# Patient Record
Sex: Male | Born: 1955 | Race: White | Hispanic: No | Marital: Married | Smoking: Never smoker
Health system: Southern US, Community
[De-identification: ages and names within clinical notes are randomized; demographics above are authoritative.]

## PROBLEM LIST (undated history)

## (undated) DIAGNOSIS — E785 Hyperlipidemia, unspecified: Secondary | ICD-10-CM

## (undated) DIAGNOSIS — A6002 Herpesviral infection of other male genital organs: Secondary | ICD-10-CM

## (undated) DIAGNOSIS — K449 Diaphragmatic hernia without obstruction or gangrene: Secondary | ICD-10-CM

## (undated) DIAGNOSIS — N529 Male erectile dysfunction, unspecified: Secondary | ICD-10-CM

## (undated) DIAGNOSIS — K6289 Other specified diseases of anus and rectum: Secondary | ICD-10-CM

## (undated) HISTORY — DX: Hyperlipidemia, unspecified: E78.5

## (undated) HISTORY — DX: Diaphragmatic hernia without obstruction or gangrene: K44.9

## (undated) HISTORY — DX: Herpesviral infection of other male genital organs: A60.02

## (undated) HISTORY — DX: Other specified diseases of anus and rectum: K62.89

## (undated) HISTORY — DX: Male erectile dysfunction, unspecified: N52.9

---
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
## 1987-12-26 HISTORY — PX: RETINAL DETACHMENT SURGERY: SHX105

## 1993-12-25 HISTORY — PX: SHOULDER OPEN ROTATOR CUFF REPAIR: SHX2407

## 2002-02-28 ENCOUNTER — Encounter: Payer: Self-pay | Admitting: Family Medicine

## 2002-02-28 ENCOUNTER — Encounter: Admission: RE | Admit: 2002-02-28 | Discharge: 2002-02-28 | Payer: Self-pay | Admitting: Family Medicine

## 2002-07-25 ENCOUNTER — Encounter: Payer: Self-pay | Admitting: Family Medicine

## 2002-07-25 ENCOUNTER — Encounter: Admission: RE | Admit: 2002-07-25 | Discharge: 2002-07-25 | Payer: Self-pay | Admitting: Family Medicine

## 2005-07-18 ENCOUNTER — Ambulatory Visit (HOSPITAL_COMMUNITY): Admission: RE | Admit: 2005-07-18 | Discharge: 2005-07-18 | Payer: Self-pay | Admitting: Gastroenterology

## 2005-07-18 LAB — HM COLONOSCOPY: HM Colonoscopy: NORMAL

## 2006-06-12 ENCOUNTER — Ambulatory Visit: Payer: Self-pay | Admitting: Family Medicine

## 2006-07-17 ENCOUNTER — Ambulatory Visit: Payer: Self-pay | Admitting: Family Medicine

## 2007-03-18 ENCOUNTER — Ambulatory Visit: Payer: Self-pay | Admitting: Family Medicine

## 2007-03-26 ENCOUNTER — Ambulatory Visit: Payer: Self-pay | Admitting: Family Medicine

## 2007-04-04 ENCOUNTER — Ambulatory Visit: Payer: Self-pay | Admitting: Family Medicine

## 2007-12-03 ENCOUNTER — Ambulatory Visit: Payer: Self-pay | Admitting: Family Medicine

## 2008-02-21 ENCOUNTER — Ambulatory Visit: Payer: Self-pay | Admitting: Family Medicine

## 2008-03-31 ENCOUNTER — Ambulatory Visit: Payer: Self-pay | Admitting: Family Medicine

## 2008-12-15 ENCOUNTER — Ambulatory Visit: Payer: Self-pay | Admitting: Family Medicine

## 2009-06-18 ENCOUNTER — Ambulatory Visit: Payer: Self-pay | Admitting: Family Medicine

## 2009-09-20 ENCOUNTER — Ambulatory Visit: Payer: Self-pay | Admitting: Family Medicine

## 2010-07-27 ENCOUNTER — Ambulatory Visit: Payer: Self-pay | Admitting: Family Medicine

## 2010-10-13 ENCOUNTER — Ambulatory Visit: Payer: Self-pay | Admitting: Family Medicine

## 2011-01-06 ENCOUNTER — Ambulatory Visit
Admission: RE | Admit: 2011-01-06 | Discharge: 2011-01-06 | Payer: Self-pay | Source: Home / Self Care | Attending: Family Medicine | Admitting: Family Medicine

## 2011-04-11 ENCOUNTER — Ambulatory Visit (INDEPENDENT_AMBULATORY_CARE_PROVIDER_SITE_OTHER): Payer: BLUE CROSS/BLUE SHIELD | Admitting: Family Medicine

## 2011-04-11 DIAGNOSIS — M79609 Pain in unspecified limb: Secondary | ICD-10-CM

## 2011-06-23 ENCOUNTER — Encounter: Payer: Self-pay | Admitting: Family Medicine

## 2011-08-07 ENCOUNTER — Telehealth: Payer: Self-pay | Admitting: Family Medicine

## 2011-08-07 MED ORDER — VALACYCLOVIR HCL 500 MG PO TABS: 500.0000 mg | ORAL_TABLET | Freq: Two times a day (BID) | ORAL | Status: AC

## 2011-08-07 NOTE — Telephone Encounter (Signed)
Valtrex renewed.  

## 2011-09-08 ENCOUNTER — Encounter: Payer: Self-pay | Admitting: Family Medicine

## 2011-09-08 ENCOUNTER — Ambulatory Visit (INDEPENDENT_AMBULATORY_CARE_PROVIDER_SITE_OTHER): Payer: BLUE CROSS/BLUE SHIELD | Admitting: Family Medicine

## 2011-09-08 VITALS — BP 108/78 | HR 64 | Ht 73.0 in | Wt 230.0 lb

## 2011-09-08 DIAGNOSIS — E785 Hyperlipidemia, unspecified: Secondary | ICD-10-CM

## 2011-09-08 DIAGNOSIS — Z23 Encounter for immunization: Secondary | ICD-10-CM

## 2011-09-08 DIAGNOSIS — N529 Male erectile dysfunction, unspecified: Secondary | ICD-10-CM

## 2011-09-08 DIAGNOSIS — N509 Disorder of male genital organs, unspecified: Secondary | ICD-10-CM

## 2011-09-08 DIAGNOSIS — Z0000 Encounter for general adult medical examination without abnormal findings: Secondary | ICD-10-CM

## 2011-09-08 DIAGNOSIS — R079 Chest pain, unspecified: Secondary | ICD-10-CM

## 2011-09-08 DIAGNOSIS — R252 Cramp and spasm: Secondary | ICD-10-CM

## 2011-09-08 DIAGNOSIS — R102 Pelvic and perineal pain: Secondary | ICD-10-CM

## 2011-09-08 NOTE — Progress Notes (Signed)
Subjective:    Patient ID: Jason Ibarra, male    DOB: 1956/03/27, 55 y.o.   MRN: [id number]  HPI He is here for complete examination. He has had some bilateral leg cramping that has been intermittent in nature. He continues to have right-sided perineal discomfort. He was seen by urology concerning this however I have no record. His had no discharge, decreased stream, hesitancy. He has had difficulty with erectile dysfunction however his had side effects from the medications causing cramping and headaches. He did have one episode of intermittent chest pain that he describes as a stabbing lasting just for a second that occurred last night. No shortness of breath, diaphoresis. He does have a lesion present on the shaft of the penis has been evaluated in the past and he would like it evaluated again. He continues on a multivitamin . His family and social history were reviewed.   Review of Systems Negative except as above    Objective:   Physical Exam BP 108/78  Pulse 64  Ht 6\' 1"  (1.854 m)  Wt 230 lb (104.327 kg)  BMI 30.34 kg/m2  General Appearance:    Alert, cooperative, no distress, appears stated age  Head:    Normocephalic, without obvious abnormality, atraumatic  Eyes:    PERRL, conjunctiva/corneas clear, EOM's intact, fundi    benign  Ears:    Normal TM's and external ear canals  Nose:   Nares normal, mucosa normal, no drainage or sinus   tenderness  Throat:   Lips, mucosa, and tongue normal; teeth and gums normal  Neck:   Supple, no lymphadenopathy;  thyroid:  no   enlargement/tenderness/nodules; no carotid   bruit or JVD  Back:    Spine nontender, no curvature, ROM normal, no CVA     tenderness  Lungs:     Clear to auscultation bilaterally without wheezes, rales or     ronchi; respirations unlabored  Chest Wall:    No tenderness or deformity   Heart:    Regular rate and rhythm, S1 and S2 normal, no murmur, rub   or gallop  Breast Exam:    No chest wall tenderness, masses or  gynecomastia  Abdomen:     Soft, non-tender, nondistended, normoactive bowel sounds,    no masses, no hepatosplenomegaly  Genitalia:    Normal male external genitalia with a round smooth 1.5 cm lesion at the base of the penis on the ventral surface.  Testicles without masses.        Extremities:   No clubbing, cyanosis or edema  Pulses:   2+ and symmetric all extremities  Skin:   Skin color, texture, turgor normal, no rashes or lesions  Lymph nodes:   Cervical, supraclavicular, and axillary nodes normal  Neurologic:   CNII-XII intact, normal strength, sensation and gait; reflexes 2+ and symmetric throughout          Psych:   Normal mood, affect, hygiene and grooming.    EKG shows no acute changes      Assessment & Plan:   1. Hyperlipidemia LDL goal < 100  Lipid panel  2. ED (erectile dysfunction)    3. Perineal pain in male  Ambulatory referral to Urology  4. Leg cramps    5. Chest pain  PR ELECTROCARDIOGRAM, COMPLETE  6. Routine general medical examination at a health care facility  Flu vaccine greater than or equal to 3yo with preservative IM, CBC w/Diff, Comprehensive metabolic panel, Lipid panel   he is not interested  in trying Viagra which is the only ED medication that  he has not tried. Discussed leg cramps with him and did mention the possible use of mustard.

## 2011-09-08 NOTE — Progress Notes (Signed)
Pt scheduled with Dr. Retta Diones [facility] on 11-03-11 at 2:45pm.  Pt is aware of appointment. CM, LPN

## 2011-09-09 LAB — LIPID PANEL
Cholesterol: 195 mg/dL (ref 0–200)
HDL: 34 mg/dL — ABNORMAL LOW (ref >39)
Triglycerides: 187 mg/dL — ABNORMAL HIGH (ref <150)

## 2011-09-09 LAB — CBC WITH DIFFERENTIAL/PLATELET
Basophils Absolute: 0 10*3/uL (ref 0.0–0.1)
Basophils Relative: 0 % (ref 0–1)
Eosinophils Absolute: 0.1 10*3/uL (ref 0.0–0.7)
Eosinophils Relative: 1 % (ref 0–5)
HCT: 46.7 % (ref 39.0–52.0)
Hemoglobin: 15.5 g/dL (ref 13.0–17.0)
Lymphocytes Relative: 24 % (ref 12–46)
Lymphs Abs: 1.5 10*3/uL (ref 0.7–4.0)
MCH: 30.1 pg (ref 26.0–34.0)
MCHC: 33.2 g/dL (ref 30.0–36.0)
MCV: 90.7 fL (ref 78.0–100.0)
Monocytes Absolute: 0.6 10*3/uL (ref 0.1–1.0)
Monocytes Relative: 9 % (ref 3–12)
Neutro Abs: 4.2 10*3/uL (ref 1.7–7.7)
Neutrophils Relative %: 65 % (ref 43–77)
Platelets: 299 10*3/uL (ref 150–400)
RBC: 5.15 MIL/uL (ref 4.22–5.81)
RDW: 13.6 % (ref 11.5–15.5)
WBC: 6.4 10*3/uL (ref 4.0–10.5)

## 2011-09-09 LAB — COMPREHENSIVE METABOLIC PANEL
Albumin: 4 g/dL (ref 3.5–5.2)
Alkaline Phosphatase: 57 U/L (ref 39–117)
BUN: 12 mg/dL (ref 6–23)
CO2: 26 mEq/L (ref 19–32)
Calcium: 8.9 mg/dL (ref 8.4–10.5)
Chloride: 104 mEq/L (ref 96–112)
Glucose, Bld: 97 mg/dL (ref 70–99)
Potassium: 4.4 mEq/L (ref 3.5–5.3)

## 2011-09-11 ENCOUNTER — Telehealth: Payer: Self-pay

## 2011-09-11 NOTE — Telephone Encounter (Signed)
Called to inform pt labs look good

## 2011-09-11 NOTE — Telephone Encounter (Signed)
Called pt to give lipid results

## 2011-12-05 ENCOUNTER — Encounter (INDEPENDENT_AMBULATORY_CARE_PROVIDER_SITE_OTHER): Payer: Self-pay | Admitting: General Surgery

## 2011-12-05 ENCOUNTER — Ambulatory Visit (INDEPENDENT_AMBULATORY_CARE_PROVIDER_SITE_OTHER): Payer: BLUE CROSS/BLUE SHIELD | Admitting: General Surgery

## 2011-12-05 VITALS — BP 128/90 | HR 68 | Temp 97.6°F | Resp 16 | Ht 75.25 in | Wt 232.0 lb

## 2011-12-05 DIAGNOSIS — K4020 Bilateral inguinal hernia, without obstruction or gangrene, not specified as recurrent: Secondary | ICD-10-CM | POA: Insufficient documentation

## 2011-12-05 NOTE — Progress Notes (Signed)
Patient ID: Jason Ibarra, male   DOB: Oct 10, 1956, 55 y.o.   MRN: [id number]  Chief Complaint  Patient presents with  . Other    Eval right inguinal hernia    HPI Jason Ibarra is a 55 y.o. male.   HPI   He is referred by Dr. Retta Diones for further evaluation and treatment of a right inguinal hernia. Dr. Retta Diones saw him because of right-sided perineal and thigh discomfort. He has some pain extending down to his right foot. On examination, a right inguinal hernia was detected. No significant perineal pathology was noted. He was known to have a small cyst on his penis. The patient states he has discomfort in the right groin area as well. He is also noted some intermittent deviation in his penis at times. No difficulty with urination. No constipation. No chronic cough.  Past Medical History  Diagnosis Date  . Herpes genitalis in men   . Hemorrhoids   . Dyslipidemia   . ED (erectile dysfunction)   . Hyperlipidemia     borderline per patient  . Abdominal pain   . Rectal pain   . Hiatal hernia     Past Surgical History  Procedure Date  . Retinal detachment surgery 1989  . Shoulder open rotator cuff repair 1995  . Tonsillectomy 1962    Family History  Problem Relation Age of Onset  . Hypertension Father     low blood pressure  . Heart disease Father 87    Arrhythmia  . Cancer Paternal Aunt   . Emphysema Paternal Grandmother   . Cancer Maternal Grandmother     ovarian    Social History History  Substance Use Topics  . Smoking status: Never Smoker   . Smokeless tobacco: Never Used  . Alcohol Use: 1.2 oz/week    1 Cans of beer, 1 Shots of liquor per week     2 per month    Allergies  Allergen Reactions  . Penicillins Cross Reactors     Current Outpatient Prescriptions  Medication Sig Dispense Refill  . Multiple Vitamins-Minerals (MULTIVITAMIN WITH MINERALS) tablet Take 1 tablet by mouth daily.        . vitamin C (ASCORBIC ACID) 500 MG tablet Take 500 mg by  mouth daily.        . vitamin E 1000 UNIT capsule Take 1,000 Units by mouth daily.          Review of Systems Review of Systems  Constitutional: Negative.   Respiratory: Negative.   Cardiovascular: Negative.   Gastrointestinal: Negative.   Genitourinary: Negative for dysuria and frequency.       Right groin pain.  Perineal pain.  Hematological: Negative.     Blood pressure 128/90, pulse 68, temperature 97.6 F (36.4 C), temperature source Temporal, resp. rate 16, height 6' 3.25" (1.911 m), weight 232 lb (105.235 kg).  Physical Exam Physical Exam  Constitutional: He appears well-developed and well-nourished. No distress.  HENT:  Head: Normocephalic and atraumatic.  Cardiovascular: Normal rate and regular rhythm.   Pulmonary/Chest: Effort normal and breath sounds normal.  Abdominal: Soft. He exhibits no distension and no mass. There is no tenderness.  Genitourinary: Penis normal.       Moderate sized reducible right inguinal bulge. Small reducible left inguinal bulge. No penile curvature.  Musculoskeletal: Normal range of motion. He exhibits no edema.  Psychiatric: He has a normal mood and affect. His behavior is normal.    Data Reviewed Dr. Lenoria Chime note.  Assessment  Bilateral inguinal hernia with right side being the symptomatic side. I do not think the pain running down his leg to his foot is related to this.  He may have some sciatica.  Plan    We discussed unilateral hernia repair or bilateral hernia repair. I recommend a laparoscopic repair of the bilateral hernias with mesh. He is in agreement with this. However, he would like to wait until spring. Thus I will see him back in 4 months or sooner if hernia becomes more symptomatic.  I have explained the procedure, risks, and aftercare of inguinal hernia repair.  Risks include but are not limited to bleeding, infection, wound problems, anesthesia, recurrence, bladder or intestine injury, urinary retention,  testicular dysfunction, chronic pain, mesh problems.  He seems to understand and agrees to proceed.       Nalini Alcaraz J 12/05/2011, 5:22 PM

## 2011-12-05 NOTE — Patient Instructions (Signed)
Call for a sooner appointment if the hernia is bothering you more.

## 2012-08-06 ENCOUNTER — Encounter: Payer: Self-pay | Admitting: Family Medicine

## 2012-08-06 ENCOUNTER — Ambulatory Visit (INDEPENDENT_AMBULATORY_CARE_PROVIDER_SITE_OTHER): Payer: BC Managed Care – PPO | Admitting: Family Medicine

## 2012-08-06 VITALS — BP 110/60 | HR 70 | Temp 98.3°F | Wt 222.0 lb

## 2012-08-06 DIAGNOSIS — J019 Acute sinusitis, unspecified: Secondary | ICD-10-CM

## 2012-08-06 DIAGNOSIS — J309 Allergic rhinitis, unspecified: Secondary | ICD-10-CM

## 2012-08-06 MED ORDER — AZITHROMYCIN 500 MG PO TABS: 500.0000 mg | ORAL_TABLET | Freq: Every day | ORAL | Status: AC

## 2012-08-06 NOTE — Progress Notes (Signed)
  Subjective:    Patient ID: Jason Ibarra, male    DOB: 05/27/1956, 55 y.o.   MRN: [id number]  HPI Approximately 4 days ago after mowing he had the onset of sore throat followed by nasal congestion and rhinorrhea and now cough ,PND and hoarse voice. No fever, chills, earache. He's also been sneezing and having artery eyes. He has tried Claritin. He does not smoke.  Review of Systems     Objective:   Physical Exam alert and in no distress. Tympanic membranes and canals are normal. Nasal mucosa is swollen and slightly red. No tenderness over maxillary or frontal sinuses. Throat is clear. Tonsils are normal. Neck is supple without adenopathy or thyromegaly. Cardiac exam shows a regular sinus rhythm without murmurs or gallops. Lungs are clear to auscultation.        Assessment & Plan:   1. Acute sinusitis  azithromycin (ZITHROMAX) 500 MG tablet  2. Allergic rhinitis, mild     Discussed the possibility of waiting and seeing how he did over the next several days however he would like to be more aggressive. He will call me in 7-10 days if still having some symptoms.

## 2012-08-06 NOTE — Patient Instructions (Signed)
Take the antibiotic for the next 3 days but down the road in about 7-10 days if you're not totally back to normal give me a call

## 2012-08-28 ENCOUNTER — Telehealth: Payer: Self-pay | Admitting: Internal Medicine

## 2012-08-28 MED ORDER — AZITHROMYCIN 500 MG PO TABS: 500.0000 mg | ORAL_TABLET | Freq: Every day | ORAL | Status: AC

## 2012-08-28 NOTE — Telephone Encounter (Signed)
PT INFORMED

## 2012-08-28 NOTE — Telephone Encounter (Signed)
Let him know that I called in more azithromycin. Tell him that I want to treat the cause of the cough rather than just the cough

## 2012-08-28 NOTE — Telephone Encounter (Signed)
He states that he is better but still coughing. I will give him another course of azithromycin

## 2012-10-01 ENCOUNTER — Encounter (INDEPENDENT_AMBULATORY_CARE_PROVIDER_SITE_OTHER): Payer: Self-pay | Admitting: General Surgery

## 2013-03-24 ENCOUNTER — Encounter: Payer: Self-pay | Admitting: Family Medicine

## 2013-03-24 ENCOUNTER — Ambulatory Visit (INDEPENDENT_AMBULATORY_CARE_PROVIDER_SITE_OTHER): Payer: BC Managed Care – PPO | Admitting: Family Medicine

## 2013-03-24 VITALS — BP 116/78 | HR 78 | Ht 72.5 in | Wt 227.0 lb

## 2013-03-24 DIAGNOSIS — E785 Hyperlipidemia, unspecified: Secondary | ICD-10-CM | POA: Insufficient documentation

## 2013-03-24 DIAGNOSIS — N400 Enlarged prostate without lower urinary tract symptoms: Secondary | ICD-10-CM

## 2013-03-24 DIAGNOSIS — R195 Other fecal abnormalities: Secondary | ICD-10-CM

## 2013-03-24 DIAGNOSIS — Z0000 Encounter for general adult medical examination without abnormal findings: Secondary | ICD-10-CM

## 2013-03-24 DIAGNOSIS — N529 Male erectile dysfunction, unspecified: Secondary | ICD-10-CM

## 2013-03-24 DIAGNOSIS — Z8619 Personal history of other infectious and parasitic diseases: Secondary | ICD-10-CM | POA: Insufficient documentation

## 2013-03-24 LAB — COMPREHENSIVE METABOLIC PANEL
ALT: 17 U/L (ref 0–53)
AST: 22 U/L (ref 0–37)
CO2: 27 mEq/L (ref 19–32)
Calcium: 9.4 mg/dL (ref 8.4–10.5)
Chloride: 106 mEq/L (ref 96–112)
Creat: 1.06 mg/dL (ref 0.50–1.35)
Sodium: 142 mEq/L (ref 135–145)
Total Bilirubin: 0.6 mg/dL (ref 0.3–1.2)
Total Protein: 6.8 g/dL (ref 6.0–8.3)

## 2013-03-24 LAB — CBC WITH DIFFERENTIAL/PLATELET
Basophils Absolute: 0 10*3/uL (ref 0.0–0.1)
Lymphocytes Relative: 18 % (ref 12–46)
Lymphs Abs: 1.6 10*3/uL (ref 0.7–4.0)
Neutrophils Relative %: 73 % (ref 43–77)
Platelets: 300 10*3/uL (ref 150–400)
RBC: 5.03 MIL/uL (ref 4.22–5.81)
RDW: 14.2 % (ref 11.5–15.5)
WBC: 8.5 10*3/uL (ref 4.0–10.5)

## 2013-03-24 LAB — HEMOCCULT GUIAC POC 1CARD (OFFICE)

## 2013-03-24 LAB — LIPID PANEL
HDL: 38 mg/dL — ABNORMAL LOW (ref >39)
LDL Cholesterol: 133 mg/dL — ABNORMAL HIGH (ref 0–99)
Total CHOL/HDL Ratio: 5.2 Ratio
VLDL: 26 mg/dL (ref 0–40)

## 2013-03-24 LAB — PSA: PSA: 0.56 ng/mL (ref <=4.00)

## 2013-03-24 MED ORDER — AVANAFIL 100 MG PO TABS: 1.0000 | ORAL_TABLET | ORAL | Status: DC

## 2013-03-24 NOTE — Progress Notes (Signed)
Subjective:    Patient ID: Jason Ibarra, male    DOB: [birth year], 57 y.o.   MRN: [id number]  HPI Complete examination. He is having some difficulty with persistent cough. It started about 8 weeks ago with URI symptoms. He is slowly getting better but does have coughing paroxysms. No fever, chills, sore throat or earache. He is on multivitamins. No history of allergies. He does not smoke. He also is here to discuss erectile dysfunction. He has noted a slow decline in his ability to get and maintain erection. He has tried Cialis and Levitra both of which caused headaches as well as leg cramps. His meds were reviewed. He does not smoke. Also has a history of herpes genitalis but has not had difficulty with that in quite some time. His work and home life are going quite well. Family and social history were reviewed.   Review of Systems  Constitutional: Negative.   HENT: Negative.   Eyes: Negative.   Respiratory: Negative.   Cardiovascular: Negative.   Gastrointestinal: Negative.   Genitourinary: Negative.   Musculoskeletal: Negative.   Allergic/Immunologic: Negative.   Neurological: Negative.   Hematological: Negative.   Psychiatric/Behavioral: Negative.        Objective:   Physical Exam BP 116/78  Pulse 78  Ht 6' 0.5" (1.842 m)  Wt 227 lb (102.967 kg)  BMI 30.35 kg/m2  SpO2 98%  General Appearance:    Alert, cooperative, no distress, appears stated age  Head:    Normocephalic, without obvious abnormality, atraumatic  Eyes:    PERRL, conjunctiva/corneas clear, EOM's intact, fundi    benign  Ears:    Normal TM's and external ear canals  Nose:   Nares normal, mucosa normal, no drainage or sinus   tenderness  Throat:   Lips, mucosa, and tongue normal; teeth and gums normal  Neck:   Supple, no lymphadenopathy;  thyroid:  no   enlargement/tenderness/nodules; no carotid   bruit or JVD  Back:    Spine nontender, no curvature, ROM normal, no CVA     tenderness  Lungs:     Clear to  auscultation bilaterally without wheezes, rales or     ronchi; respirations unlabored  Chest Wall:    No tenderness or deformity   Heart:    Regular rate and rhythm, S1 and S2 normal, no murmur, rub   or gallop  Breast Exam:    No chest wall tenderness, masses or gynecomastia  Abdomen:     Soft, non-tender, nondistended, normoactive bowel sounds,    no masses, no hepatosplenomegaly  Genitalia:    Normal male external genitalia without lesions.  Testicles without masses.  No inguinal hernias.  Rectal:    Normal sphincter tone, no masses or tenderness; guaiac positive stool.  Prostate smooth, no nodules, enlarged.  Extremities:   No clubbing, cyanosis or edema  Pulses:   2+ and symmetric all extremities  Skin:   Skin color, texture, turgor normal, no rashes or lesions  Lymph nodes:   Cervical, supraclavicular, and axillary nodes normal  Neurologic:   CNII-XII intact, normal strength, sensation and gait; reflexes 2+ and symmetric throughout          Psych:   Normal mood, affect, hygiene and grooming.          Assessment & Plan:  Routine general medical examination at a health care facility - Plan: Lipid panel, CBC with Differential, Comprehensive metabolic panel, PSA, Hemoccult - 1 Card (office)  BPH (benign prostatic hyperplasia) - Plan:  PSA  ED (erectile dysfunction) - Plan: Avanafil (STENDRA) 100 MG TABS  History of herpes genitalis  Hyperlipidemia LDL goal < 130 - Plan: Lipid panel  Stool guaiac positive - Plan: CBC with Differential, Ambulatory referral to Gastroenterology I discussed treatment of erectile dysfunction. I will try him on the new ED medication. He continues to have difficulty, referral will be made. Also his stool was guaiac positive and therefore referral will be made for possible colonoscopy. He is not having any urinary symptoms and therefore no therapy needed for the BPH. He has not had any trouble with the herpes and again no medicines will be given.

## 2013-03-25 NOTE — Progress Notes (Signed)
Quick Note:  CALLED PT HOME # LEFT MESSAGE BLOOD WORK IS NORMAL ______

## 2013-03-25 NOTE — Progress Notes (Signed)
Quick Note:  The blood work is normal ______ 

## 2013-04-24 ENCOUNTER — Encounter: Payer: Self-pay | Admitting: Family Medicine

## 2013-04-24 ENCOUNTER — Ambulatory Visit (INDEPENDENT_AMBULATORY_CARE_PROVIDER_SITE_OTHER): Payer: BC Managed Care – PPO | Admitting: Family Medicine

## 2013-04-24 VITALS — BP 110/70 | HR 74 | Temp 99.2°F | Wt 226.0 lb

## 2013-04-24 DIAGNOSIS — J019 Acute sinusitis, unspecified: Secondary | ICD-10-CM

## 2013-04-24 MED ORDER — AZITHROMYCIN 500 MG PO TABS: 500.0000 mg | ORAL_TABLET | Freq: Every day | ORAL | Status: DC

## 2013-04-24 NOTE — Patient Instructions (Signed)
If you're not back to normal in one week, give me a call for refill

## 2013-04-24 NOTE — Progress Notes (Signed)
  Subjective:    Patient ID: Jason Ibarra, male    DOB: 1956/04/10, 57 y.o.   MRN: [id number]  HPI A 4 day history of headache, myalgias, sinus pressure, PND. He now has a one-day history of sore throat and a dry cough as well as continued malaise. He does not smoke. He has no underlying allergies. He also complains of some slight upper tooth discomfort.   Review of Systems     Objective:   Physical Exam alert and in no distress. Tympanic membranes and canals are normal. Throat is clear. Tonsils are normal. Neck is supple without adenopathy or thyromegaly. Cardiac exam shows a regular sinus rhythm without murmurs or gallops. Lungs are clear to auscultation. Nasal mucosa is slightly red however sinuses are nontender       Assessment & Plan:  Acute sinusitis - Plan: azithromycin (ZITHROMAX) 500 MG tablet he is to call after one week if not entirely better.

## 2013-05-01 ENCOUNTER — Encounter: Payer: Self-pay | Admitting: Internal Medicine

## 2013-05-01 LAB — HM COLONOSCOPY

## 2013-05-12 ENCOUNTER — Encounter: Payer: Self-pay | Admitting: Family Medicine

## 2013-06-23 ENCOUNTER — Encounter: Payer: Self-pay | Admitting: Family Medicine

## 2013-06-23 ENCOUNTER — Ambulatory Visit (INDEPENDENT_AMBULATORY_CARE_PROVIDER_SITE_OTHER): Payer: BC Managed Care – PPO | Admitting: Family Medicine

## 2013-06-23 VITALS — BP 110/70 | HR 78 | Temp 98.0°F | Wt 226.0 lb

## 2013-06-23 DIAGNOSIS — R070 Pain in throat: Secondary | ICD-10-CM

## 2013-06-23 NOTE — Progress Notes (Signed)
  Subjective:    Patient ID: Jason Ibarra, male    DOB: 1956/09/03, 57 y.o.   MRN: [id number]  HPI Jason Ibarra after he was last seen and treated for sinus infection he developed right-sided throat pain. He points a very specific areas to where the pain is which goes from the angle of the jaw to near the [place] notch. When he swallows he does note the discomfort does increase. He also notes intermittent bouts of dizziness and and when he turns his head to the right, the discomfort increases. No nasal congestion, rhinorrhea, headache, PND, nausea or vision or hearing changes.     Review of Systems     Objective:   Physical Exam alert and in no distress. Tympanic membranes and canals are normal. Throat is clear. Tonsils are normal. Neck is supple without adenopathy or thyromegaly. Cardiac exam shows a regular sinus rhythm without murmurs or gallops. Lungs are clear to auscultation.        Assessment & Plan:  Throat pain - Plan: Ambulatory referral to ENT

## 2013-08-21 ENCOUNTER — Other Ambulatory Visit: Payer: Self-pay | Admitting: *Deleted

## 2013-08-21 ENCOUNTER — Telehealth: Payer: Self-pay | Admitting: Internal Medicine

## 2013-08-21 MED ORDER — AVANAFIL 100 MG PO TABS: 1.0000 | ORAL_TABLET | ORAL | Status: DC

## 2013-08-21 MED ORDER — VALACYCLOVIR HCL 500 MG PO TABS: 500.0000 mg | ORAL_TABLET | Freq: Two times a day (BID) | ORAL | Status: DC

## 2013-08-21 NOTE — Telephone Encounter (Signed)
Pt states he needs a refill on valtrex 500mg  and you gave him a sample of stendra 100mg  and he wants to get a rx for that. Send to [facility]

## 2013-08-21 NOTE — Telephone Encounter (Signed)
Sent Valtrex 500mg  #20 BID with PRN refills to [facility].

## 2013-08-21 NOTE — Telephone Encounter (Signed)
Find out the dosing regimen and give him PRN refills

## 2013-10-30 ENCOUNTER — Other Ambulatory Visit: Payer: Self-pay

## 2014-06-01 ENCOUNTER — Encounter: Payer: Self-pay | Admitting: Family Medicine

## 2014-06-01 ENCOUNTER — Telehealth: Payer: Self-pay

## 2014-06-01 ENCOUNTER — Ambulatory Visit (INDEPENDENT_AMBULATORY_CARE_PROVIDER_SITE_OTHER): Payer: BC Managed Care – PPO | Admitting: Family Medicine

## 2014-06-01 VITALS — BP 120/90 | HR 60 | Wt 226.0 lb

## 2014-06-01 DIAGNOSIS — K146 Glossodynia: Secondary | ICD-10-CM

## 2014-06-01 NOTE — Telephone Encounter (Signed)
error 

## 2014-06-01 NOTE — Progress Notes (Signed)
   Subjective:    Patient ID: Jason Ibarra, male    DOB: 1956/03/29, 58 y.o.   MRN: [id number]  HPI He has a four-month history of an ulcerated lesion on the right upper hard palate with a burning sensation on the right side of his tongue and jaw sensitivity. He did see his dentist 4 months ago before these symptoms started and x-rays were taken however nothing was mentioned   Review of Systems     Objective:   Physical Exam Alert and in no distress. 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 Exam of the tongue and palpation of the tongue shows no lesions. No tenderness over jaw or maxilla. Neck has no adenopathy.       Assessment & Plan:  Tongue pain  the whitish lesion on the hard palate is questionable however his symptoms of having this for several months is certainly concerning. Apparently his insurance does not have any oral surgeons in this area. He is to check with his insurance carrier to see who we can refer him to.

## 2014-06-01 NOTE — Telephone Encounter (Signed)
Pt notified and he will call his insurance to see who is in network

## 2014-06-01 NOTE — Telephone Encounter (Signed)
I called the oral surgeons in [place] and none of them take [employer]. Pt would have to pay out of pocket for any visits, do you have any other suggestions? Please Advise

## 2014-06-29 ENCOUNTER — Encounter: Payer: Self-pay | Admitting: Family Medicine

## 2014-06-29 ENCOUNTER — Ambulatory Visit (INDEPENDENT_AMBULATORY_CARE_PROVIDER_SITE_OTHER): Payer: BC Managed Care – PPO | Admitting: Family Medicine

## 2014-06-29 VITALS — BP 110/70 | HR 67 | Ht 73.0 in | Wt 230.0 lb

## 2014-06-29 DIAGNOSIS — N400 Enlarged prostate without lower urinary tract symptoms: Secondary | ICD-10-CM

## 2014-06-29 DIAGNOSIS — Z860100 Personal history of colon polyps, unspecified: Secondary | ICD-10-CM | POA: Insufficient documentation

## 2014-06-29 DIAGNOSIS — E785 Hyperlipidemia, unspecified: Secondary | ICD-10-CM

## 2014-06-29 DIAGNOSIS — F528 Other sexual dysfunction not due to a substance or known physiological condition: Secondary | ICD-10-CM

## 2014-06-29 DIAGNOSIS — Z8619 Personal history of other infectious and parasitic diseases: Secondary | ICD-10-CM

## 2014-06-29 DIAGNOSIS — Z0000 Encounter for general adult medical examination without abnormal findings: Secondary | ICD-10-CM

## 2014-06-29 DIAGNOSIS — F5221 Male erectile disorder: Secondary | ICD-10-CM

## 2014-06-29 LAB — POCT URINALYSIS DIPSTICK
Bilirubin, UA: NEGATIVE
GLUCOSE UA: NEGATIVE
Ketones, UA: NEGATIVE
Leukocytes, UA: NEGATIVE
Nitrite, UA: NEGATIVE
Protein, UA: NEGATIVE
RBC UA: NEGATIVE
SPEC GRAV UA: 1.015
UROBILINOGEN UA: NEGATIVE
pH, UA: 5

## 2014-06-29 MED ORDER — SILDENAFIL CITRATE 50 MG PO TABS: 50.0000 mg | ORAL_TABLET | Freq: Every day | ORAL | Status: DC | PRN

## 2014-06-29 NOTE — Patient Instructions (Signed)
Make sure you cut back on fluids later at night. Try the Viagra and if no results, I will refer he to urology.

## 2014-06-29 NOTE — Progress Notes (Signed)
Subjective:     HPI  Patient is a 58 y.o. male presenting for an annual physical exam.   BPH - Patient has had post-micturition dribble almost every time after he urinates, wakes up about twice a night to urinate Feels like he empties bladder, has good stream. Denies incontinence, hesitancy, urgency. Not on any medications currently.  Erectile dysfunction - uses Stendra occasionally but feels that it is not working, also reports that he felt heart racing after taking the medication. Has tried Levitra and Cialis which gave him adverse effects. He generally has not noticed a difference between any of the medications for ED. Would like to try a different medication.  Hyperlipidemia (LDL) - Eating healthier, does yard work once a week for exercise. Not currently on medication.  Stool guaiac positive - Colonoscopy done about a year ago. Patient states that he was put on 3-5 year surveillance due to finding 2 polyps. Trying to eat healthier, increasing fiber, vegetable intake and trying to work on decreasing red meat.  Genital Herpes -  Takes Valtrex for flares but has only had to use it once in the last year.  Right-side inguinal hernia and groin pain - patient reports having had an inguinal hernia for years. He has not had surgical repair and has not been interested in it. He reports that it is tender to touch. Also reports intermittent right-sided pain over his upper inner thigh. Pain is made worse with sitting for prolonged periods. Relieved by avoiding pressure.  Patient is a non-smoker and drinks rarely. He sees ophthalmologist annually, is scheduled for f/u this week. Sees dentist annually, is scheduled for f/u in August. His last TDap was in 2008. No vaccinations due at this time.  Denies any other questions or concerns at this visit.  Review of Systems  General: -fevers, -fatigue, -unexplained weight gain/loss  HENT: -headaches, -changes in vision, -changes in hearing, -throat  pain Pulmonary: -sob, -difficulty breathing, -pleuritic pain Cardiovascular: - heart racing, -palpitations, -chest pain GI: -abdominal pain, -n/v, -diarrhea, -constipation Skin: +lesion on his left arm, -skin infections;  Endocrine: -polyphagia, -polydipsia, -polyuria GU: +right-sided groin pain, +right inguinal hernia, -dysuria, -hematuria   MSK: -joint pain, -back pain, -myalgias     Objective:   Physical Exam  BP 110/70  Pulse 67  Ht 6\' 1"  (1.854 m)  Wt 230 lb (104.327 kg)  BMI 30.35 kg/m2   General appearance: alert, no distress, WD/WN,  Skin: warm, dry and intact; benign well-demarcated 1 cm slightly pigmented lesion is noted on the left forearm just proximal to the elbow HEENT: normocephalic, conjunctiva/corneas normal, sclerae anicteric, PERRLA, EOMi, nares patent, no discharge or erythema, pharynx normal Oral cavity: MMM, tongue normal, teeth normal Neck: supple, no lymphadenopathy, no thyromegaly, no masses, normal ROM Chest: non tender, normal shape and expansion Heart: RRR, normal S1, S2, no murmurs Lungs: CTA bilaterally, no wheezes, rhonchi, or rales Abdomen: +bs, soft, non tender, non distended, no masses, no hepatomegaly, no splenomegaly, no bruits GU: Right-sided inguinal tenderness Extremities: no edema, no cyanosis, no clubbing Pulses: 2+ symmetric, upper and lower extremities, normal cap refill Psychiatric: normal affect, behavior normal, pleasant       Assessment and Plan:     BPH - limit water intake at night. Continue to monitor symptoms.  Erectile dysfunction - stop Stendra, start Viagra samples 50mg -100mg  at a time. If this does not work, I will refer him to urology.  Hyperlipidemia (LDL) - repeat lipid panel today. Consider statin pending labs.  Stool  guaiac positive - s/p colonoscopy, colonic polyps found, repeat colonoscopy in 3-5 years. Continue healthy diet, avoid red meats.  Genital Herpes - stable, continue Valtrex as needed.  Right-side  inguinal hernia and groin pain - likely ostial skeletal in nature since it tends to bother him more when he sits down and when he changes positions, it helps  Continue to monitor.   Discussed healthy lifestyle, diet, exercise, preventative care, vaccinations, and addressed their concerns.    Patient was seen in conjunction with PA student Jaynee Eagles, and I have also evaluated and examined patient, agree with student's notes, student supervised by me.

## 2014-06-30 LAB — LIPID PANEL
CHOL/HDL RATIO: 5.2 ratio
CHOLESTEROL: 206 mg/dL — AB (ref 0–200)
HDL: 40 mg/dL (ref >39)
LDL Cholesterol: 136 mg/dL — ABNORMAL HIGH (ref 0–99)
TRIGLYCERIDES: 148 mg/dL (ref <150)
VLDL: 30 mg/dL (ref 0–40)

## 2014-10-09 ENCOUNTER — Other Ambulatory Visit: Payer: Self-pay

## 2014-11-06 ENCOUNTER — Encounter: Payer: Self-pay | Admitting: Family Medicine

## 2014-11-06 ENCOUNTER — Ambulatory Visit (INDEPENDENT_AMBULATORY_CARE_PROVIDER_SITE_OTHER): Payer: BC Managed Care – PPO | Admitting: Family Medicine

## 2014-11-06 VITALS — BP 120/70 | HR 61 | Temp 98.2°F | Wt 230.0 lb

## 2014-11-06 DIAGNOSIS — J019 Acute sinusitis, unspecified: Secondary | ICD-10-CM

## 2014-11-06 MED ORDER — CLARITHROMYCIN 500 MG PO TABS: 500.0000 mg | ORAL_TABLET | Freq: Two times a day (BID) | ORAL | Status: DC

## 2014-11-06 NOTE — Patient Instructions (Signed)
Take all the antibiotic and if not totally back to normal when you finish, give me a call 

## 2014-11-06 NOTE — Progress Notes (Signed)
Subjective:     Patient ID: Jason Ibarra, male   DOB: [birth year], 58 y.o.   MRN: [id number]  HPI   Jason Ibarra reports having developed a cough, headache, and sinus pressure 12 days ago.  He has developed increasing sinus pain, a productive  Cough,PNDand upper tooth pain and his symptoms are interfering with his sleep.  He denies fever, sore throat, shortness of breath, or chest pain.  He says he has symptoms like this 1-2x per year, and without treatment they do not abate.  He is a never smoker and does not experience seasonal alergies.  Notably, he has a drug allergy to penicillin.  His wife had a reported cold approximately beginning with his current symptoms, but she has recovered without complication.  Review of Systems   Denies dizziness, stiff neck, dyspnea, nausea, vomiting, or diarrhea.     Objective:   Physical Exam  HEENT: Tympanic membranes clear without swelling or drainage.  Frontal sinuses tender to  palpation.  Erythematous, boggy nares noted.  Evidence of postnasal drip.  Minimal lymphadenopathy.  CV: RRR with normal S1/S2.  Pulm: Clear to ascultation bilaterally     Assessment:     Jason Ibarra symptoms are consistent with an acute bacterial sinusitis given an evolving course without resolution now >10 days.  Furthermore, he has symptoms like this annually which respond to antibiotics.  He should receive a 10 day course of antibiotics with instruction to call for more if his symptoms do not resolve entirely.  Amoxicillin-clavanulate is first line for acute sinusitis, but given his penicillin allergy we will initiate clarithromycin.  Plan:   Acute sinusitis with symptoms > 10 days - Plan: clarithromycin (BIAXIN) 500 MG tablet

## 2015-05-26 ENCOUNTER — Ambulatory Visit (INDEPENDENT_AMBULATORY_CARE_PROVIDER_SITE_OTHER): Payer: BLUE CROSS/BLUE SHIELD | Admitting: Family Medicine

## 2015-05-26 ENCOUNTER — Encounter: Payer: Self-pay | Admitting: Family Medicine

## 2015-05-26 VITALS — BP 112/74 | HR 76 | Wt 228.8 lb

## 2015-05-26 DIAGNOSIS — M79604 Pain in right leg: Secondary | ICD-10-CM

## 2015-05-26 DIAGNOSIS — M79601 Pain in right arm: Secondary | ICD-10-CM

## 2015-05-26 NOTE — Patient Instructions (Signed)
Take 4 Advil 3 times a day for the next 10 days and if no improvement, call me

## 2015-05-26 NOTE — Progress Notes (Signed)
   Subjective:    Patient ID: [name], male    DOB: 1956-06-04, 59 y.o.   MRN: [id number]  HPI He states that 2-1/2 months ago he had the onset of a burning sensation in the right antecubital fossa. It lasted 2 days. There was no associated rash, change in color of the skin weakness. Apparently 3 days after that he lifted some heavy objects and experienced pain in the lateral forearm area. It radiated into the forearm and down into the thumb and index finger. Gripping something actually makes the pain worse. He is able to use his computer and type without difficulty. He cannot relate this to any wrist or elbow position. Profen did help.   Review of Systems     Objective:   Physical Exam Full motion of the right elbow with no swelling or palpable tenderness. Slight discomfort to the lateral forearm area. Normal motor, sensory and DTRs. Pulses are normal. Gripping the hand did cause forearm pain. Wrist position had no effect.       Assessment & Plan:  Arm pain, anterior, right I discussed options with him. This sounds more muscular than anything but no definite reason behind it. He will do ibuprofen 800 3 times a day for the next 10 days. If he continues have difficulty, I will refer him for further evaluation including ultrasound. He is comfortable with that approach.

## 2015-07-01 ENCOUNTER — Ambulatory Visit (INDEPENDENT_AMBULATORY_CARE_PROVIDER_SITE_OTHER): Payer: BLUE CROSS/BLUE SHIELD | Admitting: Family Medicine

## 2015-07-01 ENCOUNTER — Encounter: Payer: Self-pay | Admitting: Family Medicine

## 2015-07-01 VITALS — BP 120/70 | HR 68 | Ht 73.0 in | Wt 225.0 lb

## 2015-07-01 DIAGNOSIS — F5221 Male erectile disorder: Secondary | ICD-10-CM

## 2015-07-01 DIAGNOSIS — N400 Enlarged prostate without lower urinary tract symptoms: Secondary | ICD-10-CM | POA: Diagnosis not present

## 2015-07-01 DIAGNOSIS — Z23 Encounter for immunization: Secondary | ICD-10-CM

## 2015-07-01 DIAGNOSIS — Z0000 Encounter for general adult medical examination without abnormal findings: Secondary | ICD-10-CM | POA: Diagnosis not present

## 2015-07-01 DIAGNOSIS — E785 Hyperlipidemia, unspecified: Secondary | ICD-10-CM | POA: Diagnosis not present

## 2015-07-01 DIAGNOSIS — F528 Other sexual dysfunction not due to a substance or known physiological condition: Secondary | ICD-10-CM

## 2015-07-01 DIAGNOSIS — Z8619 Personal history of other infectious and parasitic diseases: Secondary | ICD-10-CM | POA: Diagnosis not present

## 2015-07-01 DIAGNOSIS — K4020 Bilateral inguinal hernia, without obstruction or gangrene, not specified as recurrent: Secondary | ICD-10-CM

## 2015-07-01 LAB — LIPID PANEL
Cholesterol: 200 mg/dL (ref 0–200)
HDL: 41 mg/dL (ref >=40)
LDL Cholesterol: 131 mg/dL — ABNORMAL HIGH (ref 0–99)
TRIGLYCERIDES: 141 mg/dL (ref <150)
Total CHOL/HDL Ratio: 4.9 Ratio
VLDL: 28 mg/dL (ref 0–40)

## 2015-07-01 LAB — CBC WITH DIFFERENTIAL/PLATELET
Basophils Absolute: 0 10*3/uL (ref 0.0–0.1)
Basophils Relative: 0 % (ref 0–1)
EOS ABS: 0.1 10*3/uL (ref 0.0–0.7)
Eosinophils Relative: 1 % (ref 0–5)
HCT: 46.3 % (ref 39.0–52.0)
Hemoglobin: 16 g/dL (ref 13.0–17.0)
Lymphocytes Relative: 25 % (ref 12–46)
Lymphs Abs: 2 10*3/uL (ref 0.7–4.0)
MCH: 30.2 pg (ref 26.0–34.0)
MCHC: 34.6 g/dL (ref 30.0–36.0)
MCV: 87.4 fL (ref 78.0–100.0)
MONOS PCT: 9 % (ref 3–12)
MPV: 10.2 fL (ref 8.6–12.4)
Monocytes Absolute: 0.7 10*3/uL (ref 0.1–1.0)
NEUTROS PCT: 65 % (ref 43–77)
Neutro Abs: 5.3 10*3/uL (ref 1.7–7.7)
Platelets: 325 10*3/uL (ref 150–400)
RBC: 5.3 MIL/uL (ref 4.22–5.81)
RDW: 14 % (ref 11.5–15.5)
WBC: 8.1 10*3/uL (ref 4.0–10.5)

## 2015-07-01 LAB — POCT URINALYSIS DIPSTICK
BILIRUBIN UA: NEGATIVE
Blood, UA: NEGATIVE
Glucose, UA: NEGATIVE
KETONES UA: NEGATIVE
LEUKOCYTES UA: NEGATIVE
Nitrite, UA: NEGATIVE
PH UA: 6
PROTEIN UA: NEGATIVE
Spec Grav, UA: 1.025
Urobilinogen, UA: NEGATIVE

## 2015-07-01 LAB — COMPREHENSIVE METABOLIC PANEL
ALK PHOS: 67 U/L (ref 39–117)
ALT: 19 U/L (ref 0–53)
AST: 20 U/L (ref 0–37)
Albumin: 4.3 g/dL (ref 3.5–5.2)
BUN: 17 mg/dL (ref 6–23)
CALCIUM: 9.6 mg/dL (ref 8.4–10.5)
CHLORIDE: 102 meq/L (ref 96–112)
CO2: 25 mEq/L (ref 19–32)
CREATININE: 1.09 mg/dL (ref 0.50–1.35)
Glucose, Bld: 90 mg/dL (ref 70–99)
Potassium: 4 mEq/L (ref 3.5–5.3)
Sodium: 140 mEq/L (ref 135–145)
Total Bilirubin: 0.7 mg/dL (ref 0.2–1.2)
Total Protein: 7.2 g/dL (ref 6.0–8.3)

## 2015-07-01 MED ORDER — SILDENAFIL CITRATE 50 MG PO TABS: 50.0000 mg | ORAL_TABLET | Freq: Every day | ORAL | Status: DC | PRN

## 2015-07-01 NOTE — Progress Notes (Signed)
Subjective:    Patient ID: Jason Ibarra, male    DOB: 14-Feb-1956, 59 y.o.   MRN: [id number]  HPI He is here for complete examination. He does state his arm is doing much better. He has also gotten involved with a better diet and has lost several pounds. He does occasionally have some leg cramping. He also notes some minor difficulty with decreased stream and some hesitancy however he is not interested in pursuing this further. Does have difficulty with erectile dysfunction and would like a refill on his Viagra. He still does have the hernias but has yet to do anything about them. He has a previous history of herpes genitalis but has not had an outbreak recently. Health maintenance and immunizations were reviewed. Family and social history is also reviewed. His work is going well as is his home life. He has no chest pain, shortness of breath or GI concerns.   Review of Systems  All other systems reviewed and are negative.      Objective:   Physical Exam BP 120/70 mmHg  Pulse 68  Ht 6\' 1"  (1.854 m)  Wt 225 lb (102.059 kg)  BMI 29.69 kg/m2  SpO2 97%  General Appearance:    Alert, cooperative, no distress, appears stated age  Head:    Normocephalic, without obvious abnormality, atraumatic  Eyes:    PERRL, conjunctiva/corneas clear, EOM's intact, fundi    benign  Ears:    Normal TM's and external ear canals  Nose:   Nares normal, mucosa normal, no drainage or sinus   tenderness  Throat:   Lips, mucosa, and tongue normal; teeth and gums normal  Neck:   Supple, no lymphadenopathy;  thyroid:  no   enlargement/tenderness/nodules; no carotid   bruit or JVD  Back:    Spine nontender, no curvature, ROM normal, no CVA     tenderness  Lungs:     Clear to auscultation bilaterally without wheezes, rales or     ronchi; respirations unlabored  Chest Wall:    No tenderness or deformity   Heart:    Regular rate and rhythm, S1 and S2 normal, no murmur, rub   or gallop  Breast Exam:    No chest  wall tenderness, masses or gynecomastia  Abdomen:     Soft, non-tender, nondistended, normoactive bowel sounds,    no masses, no hepatosplenomegaly        Extremities:   No clubbing, cyanosis or edema  Pulses:   2+ and symmetric all extremities  Skin:   Skin color, texture, turgor normal, no rashes or lesions  Lymph nodes:   Cervical, supraclavicular, and axillary nodes normal  Neurologic:   CNII-XII intact, normal strength, sensation and gait; reflexes 2+ and symmetric throughout          Psych:   Normal mood, affect, hygiene and grooming.          Assessment & Plan:  Routine general medical examination at a health care facility - Plan: CBC with Differential/Platelet, Comprehensive metabolic panel, Lipid panel, POCT Urinalysis Dipstick  ED (erectile dysfunction) of non-organic origin - Plan: sildenafil (VIAGRA) 50 MG tablet  Bilateral inguinal hernia without obstruction or gangrene, recurrence not specified  BPH (benign prostatic hyperplasia)  Hyperlipidemia with target LDL less than 130 - Plan: Lipid panel  History of herpes genitalis  Need for varicella vaccine - Plan: Varicella-zoster vaccine subcutaneous I encouraged him to continue with his weight loss and encouraged to increase physical activity. He will call if  he has further issues with urinating. At this point he is not interested in further evaluation or treatment.

## 2015-12-17 ENCOUNTER — Encounter: Payer: Self-pay | Admitting: Medical

## 2015-12-17 ENCOUNTER — Ambulatory Visit (INDEPENDENT_AMBULATORY_CARE_PROVIDER_SITE_OTHER): Payer: BLUE CROSS/BLUE SHIELD | Admitting: Medical

## 2015-12-17 VITALS — BP 110/70 | HR 73 | Temp 98.1°F | Resp 20 | Wt 231.0 lb

## 2015-12-17 DIAGNOSIS — J0110 Acute frontal sinusitis, unspecified: Secondary | ICD-10-CM

## 2015-12-17 MED ORDER — CLARITHROMYCIN 500 MG PO TABS: 500.0000 mg | ORAL_TABLET | Freq: Two times a day (BID) | ORAL | Status: DC

## 2015-12-17 NOTE — Progress Notes (Signed)
  Subjective:  Jason Ibarra is a 59 y.o. male who presents for possible sinus infection.   Seems to get this yearly in December.   He reports about a week hx/o sinus pressure ,stuffy head, eyes feel squeezed, head congestion, achiness, ear congestion, voice hoarse, drainage.   Denies fever, no NVD.  Some chest congestion.  using OTC cold and sinus medication.  Using OTC nasal spray.  Nonsmoker.  No sick contacts.   No other aggravating or relieving factors.  No other c/o.  Past Medical History  Diagnosis Date  . Herpes genitalis in men   . Hemorrhoids   . Dyslipidemia   . ED (erectile dysfunction)   . Hyperlipidemia     borderline per patient  . Abdominal pain   . Rectal pain   . Hiatal hernia     ROS as in subjective   Objective: BP 110/70 mmHg  Pulse 73  Temp(Src) 98.1 F (36.7 C) (Oral)  Resp 20  Wt 231 lb (104.781 kg)  SpO2 98%  General appearance: Alert, WD/WN, no distress                             Skin: warm, no rash                           Head: + frontal sinus tenderness,                            Eyes: conjunctiva normal, corneas clear, PERRLA                            Ears: pearly TMs, external ear canals normal                          Nose: septum midline, turbinates swollen, with erythema and mucoid discharge             Mouth/throat: MMM, tongue normal, mild pharyngeal erythema                           Neck: supple, no adenopathy, no thyromegaly, non tender                         Lungs: CTA bilaterally, no wheezes, rales, or rhonchi      Assessment  Encounter Diagnosis  Name Primary?  . Acute frontal sinusitis, recurrence not specified Yes      Plan: Begin Biaxin, supportive care, rest, hydration, neti pot, and if worse or not improving in the next several days, then call or return.

## 2016-02-29 ENCOUNTER — Ambulatory Visit (INDEPENDENT_AMBULATORY_CARE_PROVIDER_SITE_OTHER): Payer: BLUE CROSS/BLUE SHIELD | Admitting: Family Medicine

## 2016-02-29 ENCOUNTER — Encounter: Payer: Self-pay | Admitting: Family Medicine

## 2016-02-29 VITALS — BP 120/70 | HR 66 | Wt 229.0 lb

## 2016-02-29 DIAGNOSIS — L291 Pruritus scroti: Secondary | ICD-10-CM | POA: Diagnosis not present

## 2016-02-29 NOTE — Progress Notes (Signed)
   Subjective:    Patient ID: [name], male    DOB: 07-22-1956, 60 y.o.   MRN: [id number]  HPI He complains of a several month history of scrotal itching. He has tried various OTC medications without success.   Review of Systems     Objective:   Physical Exam Exam of the penis scrotum and inguinal area shows no erythema, dryness or other skin changes.       Assessment & Plan:  Scrotal itching - Plan: Ambulatory referral to Dermatology the etiology of this is unclear and I will therefore refer to dermatology.

## 2016-03-01 NOTE — Progress Notes (Signed)
Appointment with [facility]. Wednesday  March 15 th  @ 11 am

## 2016-07-03 ENCOUNTER — Ambulatory Visit (INDEPENDENT_AMBULATORY_CARE_PROVIDER_SITE_OTHER): Payer: BLUE CROSS/BLUE SHIELD | Admitting: Family Medicine

## 2016-07-03 ENCOUNTER — Encounter: Payer: Self-pay | Admitting: Family Medicine

## 2016-07-03 VITALS — BP 114/70 | HR 73 | Ht 73.0 in | Wt 227.0 lb

## 2016-07-03 DIAGNOSIS — Z1159 Encounter for screening for other viral diseases: Secondary | ICD-10-CM

## 2016-07-03 DIAGNOSIS — Z8619 Personal history of other infectious and parasitic diseases: Secondary | ICD-10-CM | POA: Diagnosis not present

## 2016-07-03 DIAGNOSIS — E785 Hyperlipidemia, unspecified: Secondary | ICD-10-CM

## 2016-07-03 DIAGNOSIS — N400 Enlarged prostate without lower urinary tract symptoms: Secondary | ICD-10-CM

## 2016-07-03 DIAGNOSIS — F528 Other sexual dysfunction not due to a substance or known physiological condition: Secondary | ICD-10-CM | POA: Diagnosis not present

## 2016-07-03 DIAGNOSIS — F5221 Male erectile disorder: Secondary | ICD-10-CM

## 2016-07-03 DIAGNOSIS — Z0000 Encounter for general adult medical examination without abnormal findings: Secondary | ICD-10-CM | POA: Diagnosis not present

## 2016-07-03 DIAGNOSIS — Z86010 Personal history of colonic polyps: Secondary | ICD-10-CM | POA: Diagnosis not present

## 2016-07-03 LAB — POCT URINALYSIS DIPSTICK
Bilirubin, UA: NEGATIVE
Blood, UA: NEGATIVE
GLUCOSE UA: NEGATIVE
KETONES UA: NEGATIVE
Leukocytes, UA: NEGATIVE
NITRITE UA: NEGATIVE
Protein, UA: NEGATIVE
UROBILINOGEN UA: NEGATIVE
pH, UA: 6

## 2016-07-03 LAB — LIPID PANEL
CHOLESTEROL: 179 mg/dL (ref 125–200)
HDL: 40 mg/dL (ref >=40)
LDL Cholesterol: 113 mg/dL (ref <130)
TRIGLYCERIDES: 132 mg/dL (ref <150)
Total CHOL/HDL Ratio: 4.5 Ratio (ref <=5.0)
VLDL: 26 mg/dL (ref <30)

## 2016-07-03 LAB — CBC WITH DIFFERENTIAL/PLATELET
BASOS ABS: 0 {cells}/uL (ref 0–200)
Basophils Relative: 0 %
EOS PCT: 1 %
Eosinophils Absolute: 57 cells/uL (ref 15–500)
HCT: 44.7 % (ref 38.5–50.0)
HEMOGLOBIN: 15.3 g/dL (ref 13.2–17.1)
LYMPHS ABS: 1539 {cells}/uL (ref 850–3900)
Lymphocytes Relative: 27 %
MCH: 29.8 pg (ref 27.0–33.0)
MCHC: 34.2 g/dL (ref 32.0–36.0)
MCV: 87 fL (ref 80.0–100.0)
MONOS PCT: 8 %
MPV: 10.6 fL (ref 7.5–12.5)
Monocytes Absolute: 456 cells/uL (ref 200–950)
NEUTROS PCT: 64 %
Neutro Abs: 3648 cells/uL (ref 1500–7800)
PLATELETS: 305 10*3/uL (ref 140–400)
RBC: 5.14 MIL/uL (ref 4.20–5.80)
RDW: 14.2 % (ref 11.0–15.0)
WBC: 5.7 10*3/uL (ref 4.0–10.5)

## 2016-07-03 LAB — COMPREHENSIVE METABOLIC PANEL
ALK PHOS: 59 U/L (ref 40–115)
ALT: 20 U/L (ref 9–46)
AST: 20 U/L (ref 10–35)
Albumin: 4.1 g/dL (ref 3.6–5.1)
BILIRUBIN TOTAL: 0.6 mg/dL (ref 0.2–1.2)
BUN: 12 mg/dL (ref 7–25)
CALCIUM: 9 mg/dL (ref 8.6–10.3)
CO2: 26 mmol/L (ref 20–31)
Chloride: 104 mmol/L (ref 98–110)
Creat: 0.96 mg/dL (ref 0.70–1.25)
Glucose, Bld: 93 mg/dL (ref 65–99)
POTASSIUM: 4.1 mmol/L (ref 3.5–5.3)
Sodium: 139 mmol/L (ref 135–146)
TOTAL PROTEIN: 6.7 g/dL (ref 6.1–8.1)

## 2016-07-03 LAB — HEPATITIS C ANTIBODY: HCV AB: NEGATIVE

## 2016-07-03 MED ORDER — FINASTERIDE 5 MG PO TABS: 5.0000 mg | ORAL_TABLET | Freq: Every day | ORAL | Status: DC

## 2016-07-03 NOTE — Progress Notes (Signed)
Subjective:    Patient ID: Jason Ibarra, male    DOB: 04/10/1956, 60 y.o.   MRN: [id number]  HPI He is here for complete examination. He continues have difficulty with urinary problems that he states goes back at least 10 years. He complains of nocturia 2, hesitancy, occasional urgency, decreased stream. He states that these are slowly getting worse. He also complains of intermittent leg cramps but these are not associated with physical activity. He will occasionally get mouth sores but states that changing pace has helped. He states that his hernia intermittently gets trouble when he is not interested in pursuing this. He also has history of hyperlipidemia. He does have a history of colonic polyps and is scheduled for repeat colonoscopy soon. He rarely gets herpetic infections and does use Valtrex when he does. His work and home life is going well. Family and social history as well as health maintenance and immunizations were reviewed. He has no other concerns or complaints.   Review of Systems  All other systems reviewed and are negative.      Objective:   Physical Exam BP 114/70 mmHg  Pulse 73  Ht 6\' 1"  (1.854 m)  Wt 227 lb (102.967 kg)  BMI 29.96 kg/m2  General Appearance:    Alert, cooperative, no distress, appears stated age  Head:    Normocephalic, without obvious abnormality, atraumatic  Eyes:    PERRL, conjunctiva/corneas clear, EOM's intact, fundi    benign  Ears:    Normal TM's and external ear canals  Nose:   Nares normal, mucosa normal, no drainage or sinus   tenderness  Throat:   Lips, mucosa, and tongue normal; teeth and gums normal  Neck:   Supple, no lymphadenopathy;  thyroid:  no   enlargement/tenderness/nodules; no carotid   bruit or JVD     Lungs:     Clear to auscultation bilaterally without wheezes, rales or     ronchi; respirations unlabored      Heart:    Regular rate and rhythm, S1 and S2 normal, no murmur, rub   or gallop  Breast Exam:    No chest  wall tenderness, masses or gynecomastia  Abdomen:     Soft, non-tender, nondistended, normoactive bowel sounds,    no masses, no hepatosplenomegaly  Genitalia:    Normal male external genitalia without lesions.  Testicles without masses.  No inguinal hernias.  Rectal:    Normal sphincter tone, no masses or tenderness; guaiac negative stool.  Prostate smooth, no nodules, slightly enlarged.  Extremities:   No clubbing, cyanosis or edema  Pulses:   2+ and symmetric all extremities  Skin:   Skin color, texture, turgor normal, no rashes or lesions  Lymph nodes:   Cervical, supraclavicular, and axillary nodes normal  Neurologic:   CNII-XII intact, normal strength, sensation and gait; reflexes 2+ and symmetric throughout          Psych:   Normal mood, affect, hygiene and grooming.          Assessment & Plan:  Routine general medical examination at a health care facility - Plan: POCT Urinalysis Dipstick, Comprehensive metabolic panel, CBC with Differential/Platelet, Lipid panel  BPH (benign prostatic hyperplasia) - Plan: finasteride (PROSCAR) 5 MG tablet  Hyperlipidemia with target LDL less than 130 - Plan: Lipid panel  Personal history of colonic polyps - Plan: Comprehensive metabolic panel, CBC with Differential/Platelet  ED (erectile dysfunction) of non-organic origin - Plan: Comprehensive metabolic panel, CBC with Differential/Platelet  History  of herpes genitalis  Need for hepatitis C screening test - Plan: Hepatitis C antibody Scuffs treatment of his BPH. Did mention Cialis however he said that causes leg cramps. I will place him on Proscar. Explained that this would take a while to get the full effect and he is to let me know. Discussed possible use of a bladder control medicine as well. He is to follow-up with Dr. Benson Norway for his repeat colonoscopy. He will call when he needs his Valtrex renewed.

## 2016-08-03 LAB — HM COLONOSCOPY

## 2016-08-08 ENCOUNTER — Ambulatory Visit (INDEPENDENT_AMBULATORY_CARE_PROVIDER_SITE_OTHER): Payer: BLUE CROSS/BLUE SHIELD | Admitting: Family Medicine

## 2016-08-08 ENCOUNTER — Encounter: Payer: Self-pay | Admitting: Family Medicine

## 2016-08-08 VITALS — BP 100/64 | HR 60 | Wt 227.6 lb

## 2016-08-08 DIAGNOSIS — M26629 Arthralgia of temporomandibular joint, unspecified side: Secondary | ICD-10-CM

## 2016-08-08 NOTE — Progress Notes (Signed)
   Subjective:    Patient ID: Jason Ibarra, male    DOB: 1956/06/20, 60 y.o.   MRN: [id number]  HPI He complains of a several month history of soreness in the right jaw. He also notes occasional difficulty with malocclusion. The pain usually is worse in the morning and gets better as the day progresses. He also has occasional headache with this. He will occasionally hear a hissing sensation in the ear. He has not seen his dentist yet.   Review of Systems     Objective:   Physical Exam Alert and in no distress. Tender to palpation over the right TMJ. Tympanic membrane and canal normal. Throat shows a protuberance in the area of the right anterior tonsil (old finding)       Assessment & Plan:  TMJ pain dysfunction syndrome I explained that this seems to be a TMJ problem. Mentioned eating softer foods as well as using an anti-inflammatory and potentially a bite block. Strongly encouraged him to see his dentist concerning this especially since he occasionally does have a malocclusion which could potentially represent some cartilaginous damage.

## 2016-08-17 ENCOUNTER — Encounter: Payer: Self-pay | Admitting: Family Medicine

## 2016-12-07 ENCOUNTER — Encounter: Payer: Self-pay | Admitting: Family Medicine

## 2016-12-07 ENCOUNTER — Ambulatory Visit (INDEPENDENT_AMBULATORY_CARE_PROVIDER_SITE_OTHER): Payer: BLUE CROSS/BLUE SHIELD | Admitting: Family Medicine

## 2016-12-07 VITALS — BP 114/80 | HR 78 | Wt 232.0 lb

## 2016-12-07 DIAGNOSIS — H539 Unspecified visual disturbance: Secondary | ICD-10-CM | POA: Diagnosis not present

## 2016-12-07 DIAGNOSIS — Z8669 Personal history of other diseases of the nervous system and sense organs: Secondary | ICD-10-CM

## 2016-12-07 DIAGNOSIS — M26629 Arthralgia of temporomandibular joint, unspecified side: Secondary | ICD-10-CM | POA: Diagnosis not present

## 2016-12-07 NOTE — Progress Notes (Signed)
   Subjective:    Patient ID: Jason Ibarra, male    DOB: 1956-02-17, 60 y.o.   MRN: [id number]  HPI He is here for evaluation of a recent onset of seeing an octagonal-shaped spot in the midportion of the eye when he makes any motion. He notes this mainly on the right. There is no associated headache, blurred or double vision with this. He does have a previous history of migraine with aura. This usually occurs once per year. He also continues to have right TMJ discomfort. He has seen his dentist who essentially told him the same thing. He did have x-rays done on this.   Review of Systems     Objective:   Physical Exam EOMI. Conjunctiva normal. Anterior chamber appears normal. Right TMJ is slightly tender to palpation. Throat is clear. Neck is supple without adenopathy.       Assessment & Plan:  Visual disturbance - Plan: Ambulatory referral to Ophthalmology  TMJ pain dysfunction syndrome  History of migraine with aura I will refer to ophthalmology for further evaluation of the right ocular disturbance. Also discussed the TMJ symptom with him. Recommended doing a nighttime splint. If continued difficulty can refer to oral surgery for further evaluation. He is handling his migraines appropriately with using 800 mg ibuprofen when he has the aura.

## 2017-03-27 ENCOUNTER — Telehealth: Payer: Self-pay | Admitting: Family Medicine

## 2017-03-27 DIAGNOSIS — Z8619 Personal history of other infectious and parasitic diseases: Secondary | ICD-10-CM

## 2017-03-27 MED ORDER — VALACYCLOVIR HCL 500 MG PO TABS: ORAL_TABLET | ORAL | 5 refills | Status: DC

## 2017-03-27 NOTE — Telephone Encounter (Signed)
Pt called for refills of Valtrex. He states that he hasn't had to fill in a while. Please send refill to [facility] on [address]. Pt can be reached at [phone].

## 2017-07-04 ENCOUNTER — Encounter: Payer: Self-pay | Admitting: Family Medicine

## 2017-07-04 ENCOUNTER — Ambulatory Visit (INDEPENDENT_AMBULATORY_CARE_PROVIDER_SITE_OTHER): Payer: BLUE CROSS/BLUE SHIELD | Admitting: Family Medicine

## 2017-07-04 VITALS — BP 112/74 | HR 68 | Ht 73.0 in | Wt 232.0 lb

## 2017-07-04 DIAGNOSIS — N400 Enlarged prostate without lower urinary tract symptoms: Secondary | ICD-10-CM | POA: Diagnosis not present

## 2017-07-04 DIAGNOSIS — Z0000 Encounter for general adult medical examination without abnormal findings: Secondary | ICD-10-CM | POA: Diagnosis not present

## 2017-07-04 DIAGNOSIS — M25551 Pain in right hip: Secondary | ICD-10-CM | POA: Diagnosis not present

## 2017-07-04 DIAGNOSIS — F5221 Male erectile disorder: Secondary | ICD-10-CM | POA: Diagnosis not present

## 2017-07-04 DIAGNOSIS — Z23 Encounter for immunization: Secondary | ICD-10-CM

## 2017-07-04 DIAGNOSIS — Z8619 Personal history of other infectious and parasitic diseases: Secondary | ICD-10-CM | POA: Diagnosis not present

## 2017-07-04 DIAGNOSIS — R51 Headache: Secondary | ICD-10-CM | POA: Diagnosis not present

## 2017-07-04 DIAGNOSIS — E785 Hyperlipidemia, unspecified: Secondary | ICD-10-CM

## 2017-07-04 DIAGNOSIS — R519 Headache, unspecified: Secondary | ICD-10-CM

## 2017-07-04 LAB — CBC WITH DIFFERENTIAL/PLATELET
BASOS ABS: 0 {cells}/uL (ref 0–200)
BASOS PCT: 0 %
EOS ABS: 70 {cells}/uL (ref 15–500)
Eosinophils Relative: 1 %
HEMATOCRIT: 47.3 % (ref 38.5–50.0)
Hemoglobin: 15.9 g/dL (ref 13.2–17.1)
LYMPHS PCT: 28 %
Lymphs Abs: 1960 cells/uL (ref 850–3900)
MCH: 29.8 pg (ref 27.0–33.0)
MCHC: 33.6 g/dL (ref 32.0–36.0)
MCV: 88.7 fL (ref 80.0–100.0)
MONO ABS: 490 {cells}/uL (ref 200–950)
MONOS PCT: 7 %
MPV: 10 fL (ref 7.5–12.5)
Neutro Abs: 4480 cells/uL (ref 1500–7800)
Neutrophils Relative %: 64 %
PLATELETS: 333 10*3/uL (ref 140–400)
RBC: 5.33 MIL/uL (ref 4.20–5.80)
RDW: 13.8 % (ref 11.0–15.0)
WBC: 7 10*3/uL (ref 4.0–10.5)

## 2017-07-04 LAB — COMPREHENSIVE METABOLIC PANEL
ALBUMIN: 4 g/dL (ref 3.6–5.1)
ALT: 14 U/L (ref 9–46)
AST: 17 U/L (ref 10–35)
Alkaline Phosphatase: 66 U/L (ref 40–115)
BILIRUBIN TOTAL: 0.5 mg/dL (ref 0.2–1.2)
BUN: 15 mg/dL (ref 7–25)
CO2: 25 mmol/L (ref 20–31)
CREATININE: 0.98 mg/dL (ref 0.70–1.25)
Calcium: 9.1 mg/dL (ref 8.6–10.3)
Chloride: 104 mmol/L (ref 98–110)
Glucose, Bld: 86 mg/dL (ref 65–99)
Potassium: 4.7 mmol/L (ref 3.5–5.3)
SODIUM: 140 mmol/L (ref 135–146)
Total Protein: 7 g/dL (ref 6.1–8.1)

## 2017-07-04 LAB — LIPID PANEL
Cholesterol: 212 mg/dL — ABNORMAL HIGH (ref <200)
HDL: 38 mg/dL — ABNORMAL LOW (ref >40)
LDL CALC: 145 mg/dL — AB (ref <100)
TRIGLYCERIDES: 147 mg/dL (ref <150)
Total CHOL/HDL Ratio: 5.6 Ratio — ABNORMAL HIGH (ref <5.0)
VLDL: 29 mg/dL (ref <30)

## 2017-07-04 MED ORDER — SILDENAFIL CITRATE 20 MG PO TABS
20.0000 mg | ORAL_TABLET | Freq: Three times a day (TID) | ORAL | 0 refills | Status: DC
Start: 2017-07-04 — End: 2021-11-25

## 2017-07-04 NOTE — Progress Notes (Signed)
Subjective:    Patient ID: Jason Ibarra, male    DOB: 1956/08/08, 61 y.o.   MRN: [id number]  HPI He is here for complete examination. He still does have difficulty with intermittent stiffness in his neck mainly in the right occipital area. He blames this on head position especially because of work. He has no numbness, tingling or weakness with that. He also complains of intermittent difficulty with right hip pain sometimes causing increased difficulty when he lies on that side. He is concerned about arthritis. He is still having difficulty with erectile dysfunction. He would like to try Viagra again. He does have some nocturia and decreased stream. He was given finasteride in the past but did not take it due to questions about the side effects. He has a history of herpes genitalis and does occasionally use Valtrex. He has a history of hyperlipidemia but presently is on no medications. Family and social history as well as health maintenance and immunizations were reviewed.  Review of Systems  All other systems reviewed and are negative.      Objective:   Physical Exam BP 112/74   Pulse 68   Ht 6\' 1"  (1.854 m)   Wt 232 lb (105.2 kg)   SpO2 96%   BMI 30.61 kg/m   General Appearance:    Alert, cooperative, no distress, appears stated age  Head:    Normocephalic, without obvious abnormality, atraumatic  Eyes:    PERRL, conjunctiva/corneas clear, EOM's intact, fundi    benign  Ears:    Normal TM's and external ear canals  Nose:   Nares normal, mucosa normal, no drainage or sinus   tenderness  Throat:   Lips, mucosa, and tongue normal; teeth and gums normal  Neck:   Supple, no lymphadenopathy;  thyroid:  no   enlargement/tenderness/nodules; no carotid   bruit or JVD  Back:    Spine nontender, no curvature, ROM normal, no CVA     tenderness. Slight tenderness palpation in the right occipital parietal area.   Lungs:     Clear to auscultation bilaterally without wheezes, rales or      ronchi; respirations unlabored      Heart:    Regular rate and rhythm, S1 and S2 normal, no murmur, rub   or gallop     Abdomen:     Soft, non-tender, nondistended, normoactive bowel sounds,    no masses, no hepatosplenomegaly  Genitalia:  Deferred   Rectal:   deferred  Extremities:   No clubbing, cyanosis or edema. Right hip exam does show pain and limitation of internal rotation.   Pulses:   2+ and symmetric all extremities  Skin:   Skin color, texture, turgor normal, no rashes or lesions  Lymph nodes:   Cervical, supraclavicular, and axillary nodes normal  Neurologic:   CNII-XII intact, normal strength, sensation and gait; reflexes 2+ and symmetric throughout          Psych:   Normal mood, affect, hygiene and grooming.          Assessment & Plan:  Routine general medical examination at a health care facility - Plan: CBC with Differential/Platelet, Comprehensive metabolic panel, Lipid panel  Need for Tdap vaccination - Plan: Tdap vaccine greater than or equal to 7yo IM  History of herpes genitalis  Benign prostatic hyperplasia without lower urinary tract symptoms - Plan: CBC with Differential/Platelet, Comprehensive metabolic panel  Hyperlipidemia with target LDL less than 130 - Plan: Lipid panel  ED (erectile dysfunction)  of non-organic origin  Nonintractable headache, unspecified chronicity pattern, unspecified headache type  Right hip pain  prescription written for sildenafil to see if he can get it at a more reasonable cost. He will let me know how that works. Discussed proper use of the medication. Also recommended heat, stretching and possibly massage for the head and neck discomfort. Explained that I do not think this was a neurologic problem. Did mention the possibility of an injection into that area she continues have difficulty with pain. Also discussed the BPH and it will be his decision as to whether he takes the medicine based on the mom of trouble he is having with  decreased stream and nocturia. I discussed the hip pain with him and the possibility of x-rays to prove that he probably does have arthritis. Discussed the fact that he is having minimal difficulty in even hip joint injection and possible surgery as long ways off.

## 2018-07-22 ENCOUNTER — Ambulatory Visit (INDEPENDENT_AMBULATORY_CARE_PROVIDER_SITE_OTHER): Payer: Commercial Managed Care - PPO | Admitting: Family Medicine

## 2018-07-22 ENCOUNTER — Encounter: Payer: Self-pay | Admitting: Family Medicine

## 2018-07-22 VITALS — BP 114/0 | HR 61 | Temp 97.8°F | Ht 73.0 in | Wt 231.4 lb

## 2018-07-22 DIAGNOSIS — Z0000 Encounter for general adult medical examination without abnormal findings: Secondary | ICD-10-CM

## 2018-07-22 DIAGNOSIS — D229 Melanocytic nevi, unspecified: Secondary | ICD-10-CM

## 2018-07-22 DIAGNOSIS — N400 Enlarged prostate without lower urinary tract symptoms: Secondary | ICD-10-CM | POA: Diagnosis not present

## 2018-07-22 DIAGNOSIS — E785 Hyperlipidemia, unspecified: Secondary | ICD-10-CM | POA: Diagnosis not present

## 2018-07-22 DIAGNOSIS — J3489 Other specified disorders of nose and nasal sinuses: Secondary | ICD-10-CM

## 2018-07-22 DIAGNOSIS — F5221 Male erectile disorder: Secondary | ICD-10-CM | POA: Diagnosis not present

## 2018-07-22 DIAGNOSIS — M7552 Bursitis of left shoulder: Secondary | ICD-10-CM

## 2018-07-22 DIAGNOSIS — Z125 Encounter for screening for malignant neoplasm of prostate: Secondary | ICD-10-CM

## 2018-07-22 DIAGNOSIS — Z86010 Personal history of colonic polyps: Secondary | ICD-10-CM

## 2018-07-22 DIAGNOSIS — Q828 Other specified congenital malformations of skin: Secondary | ICD-10-CM

## 2018-07-22 MED ORDER — MUPIROCIN 2 % EX OINT: 1.0000 "application " | TOPICAL_OINTMENT | Freq: Two times a day (BID) | CUTANEOUS | 0 refills | Status: DC

## 2018-07-22 NOTE — Progress Notes (Signed)
Subjective:    Patient ID: Jason Ibarra, male    DOB: 01-Jun-1956, 62 y.o.   MRN: [id number]  HPI He is here for complete examination.  He does have difficulty with irritation and occasional purulent drainage from the right nostril.  Presently is not giving him much trouble.  He also has a skin tag mainly on his neck.  He does have a mole present he would like me to look at.  He does complain of difficulty with urinary incontinence, hesitancy, decreased stream nocturia x1 and hesitancy.  Continues to have difficulty with erectile dysfunction and has tried multiple ED meds with no success other than side effects. Approximately 2 months ago he prior watch his house and after that noted left shoulder pain.  Is now sometimes interfering with sleep but no numbness, tingling .  Does complain of a generalized weak feeling.  Otherwise he has no particular concerns or complaints.  Family and social history as well as health maintenance and immunizations were reviewed Does have a history of colonic polyps.  Review of Systems  All other systems reviewed and are negative.      Objective:   Physical Exam BP (!) 114/0 (BP Location: Left Arm, Patient Position: Sitting)   Pulse 61   Temp 97.8 F (36.6 C)   Ht 6\' 1"  (1.854 m)   Wt 231 lb 6.4 oz (105 kg)   SpO2 96%   BMI 30.53 kg/m   General Appearance:    Alert, cooperative, no distress, appears stated age  Head:    Normocephalic, without obvious abnormality, atraumatic  Eyes:    PERRL, conjunctiva/corneas clear, EOM's intact, fundi    benign  Ears:    Normal TM's and external ear canals  Nose:   Nares does show slight irritation to the anterior medial area of the right nostril.  L, no drainage or sinus   tenderness  Throat:   Lips, mucosa, and tongue normal; teeth and gums normal  Neck:   Supple, no lymphadenopathy;  thyroid:  no   enlargement/tenderness/nodules; no carotid   bruit or JVD     Lungs:     Clear to auscultation bilaterally  without wheezes, rales or     ronchi; respirations unlabored      Heart:    Regular rate and rhythm, S1 and S2 normal, no murmur, rub   or gallop     Abdomen:     Soft, non-tender, nondistended, normoactive bowel sounds,    no masses, no hepatosplenomegaly  Genitalia:   Deferred  Rectal:   Deferred  Extremities:   No clubbing, cyanosis or edema.  Left shoulder exam shows full motion of the shoulder but pain with abduction and external rotation.  Negative drop arm test.  Supraspinatus testing was uncomfortable.  Negative sulcus sign.  Neer's and Hawkins test was uncomfortable.  Pulses:   2+ and symmetric all extremities  Skin:   Skin color, texture, turgor normal, skin tag noted left upper neck area.  Moles were evaluated and all appeared normal  Lymph nodes:   Cervical, supraclavicular, and axillary nodes normal  Neurologic:   CNII-XII intact, normal strength, sensation and gait; reflexes 2+ and symmetric throughout          Psych:   Normal mood, affect, hygiene and grooming.           Assessment & Plan:  Benign prostatic hyperplasia without lower urinary tract symptoms - Plan: Ambulatory referral to Urology  Hyperlipidemia with target LDL less  than 130  ED (erectile dysfunction) of non-organic origin - Plan: Ambulatory referral to Urology  Routine general medical examination at a health care facility - Plan: CBC with Differential/Platelet, Comprehensive metabolic panel, Lipid panel  Personal history of colonic polyps  Nasal lesion - Plan: mupirocin ointment (BACTROBAN) 2 %  Acute shoulder bursitis, left  Numerous moles  Accessory skin tags  Screening for prostate cancer - Plan: PSA I will refer to urology to evaluate further his urinary symptoms as well as his ED.  Continue on his present medications.  Will give Bactroban to use for the nose.  He is to return here to have skin tags removed at his convenience. I then demonstrated proper shoulder rehab with range of motion as  well as rotator cuff strengthening exercises.  If continued difficulty he is to return here for reevaluation.  Discussed possible options at that point including x-rays, injections, possible referral for ultrasound.

## 2018-07-22 NOTE — Patient Instructions (Signed)
2 Aleve twice per day for the next 10 days to 2 weeks.  Range of motion exercises

## 2018-07-23 LAB — COMPREHENSIVE METABOLIC PANEL
A/G RATIO: 1.6 (ref 1.2–2.2)
ALT: 16 IU/L (ref 0–44)
AST: 17 IU/L (ref 0–40)
Albumin: 4.2 g/dL (ref 3.6–4.8)
Alkaline Phosphatase: 69 IU/L (ref 39–117)
BUN/Creatinine Ratio: 12 (ref 10–24)
BUN: 13 mg/dL (ref 8–27)
Bilirubin Total: 0.5 mg/dL (ref 0.0–1.2)
CALCIUM: 9 mg/dL (ref 8.6–10.2)
CO2: 23 mmol/L (ref 20–29)
CREATININE: 1.05 mg/dL (ref 0.76–1.27)
Chloride: 105 mmol/L (ref 96–106)
GFR calc Af Amer: 87 mL/min/{1.73_m2} (ref >59)
GFR, EST NON AFRICAN AMERICAN: 75 mL/min/{1.73_m2} (ref >59)
Globulin, Total: 2.7 g/dL (ref 1.5–4.5)
Glucose: 96 mg/dL (ref 65–99)
POTASSIUM: 4.7 mmol/L (ref 3.5–5.2)
SODIUM: 144 mmol/L (ref 134–144)
Total Protein: 6.9 g/dL (ref 6.0–8.5)

## 2018-07-23 LAB — CBC WITH DIFFERENTIAL/PLATELET
BASOS ABS: 0 10*3/uL (ref 0.0–0.2)
Basos: 0 %
EOS (ABSOLUTE): 0.1 10*3/uL (ref 0.0–0.4)
Eos: 2 %
Hematocrit: 47 % (ref 37.5–51.0)
Hemoglobin: 15.7 g/dL (ref 13.0–17.7)
IMMATURE GRANS (ABS): 0 10*3/uL (ref 0.0–0.1)
IMMATURE GRANULOCYTES: 0 %
LYMPHS ABS: 1.8 10*3/uL (ref 0.7–3.1)
Lymphs: 25 %
MCH: 29.7 pg (ref 26.6–33.0)
MCHC: 33.4 g/dL (ref 31.5–35.7)
MCV: 89 fL (ref 79–97)
MONOCYTES: 10 %
MONOS ABS: 0.7 10*3/uL (ref 0.1–0.9)
NEUTROS PCT: 63 %
Neutrophils Absolute: 4.5 10*3/uL (ref 1.4–7.0)
Platelets: 320 10*3/uL (ref 150–450)
RBC: 5.29 x10E6/uL (ref 4.14–5.80)
RDW: 13.5 % (ref 12.3–15.4)
WBC: 7.2 10*3/uL (ref 3.4–10.8)

## 2018-07-23 LAB — LIPID PANEL
CHOL/HDL RATIO: 5.6 ratio — AB (ref 0.0–5.0)
Cholesterol, Total: 201 mg/dL — ABNORMAL HIGH (ref 100–199)
HDL: 36 mg/dL — ABNORMAL LOW (ref >39)
LDL CALC: 129 mg/dL — AB (ref 0–99)
TRIGLYCERIDES: 180 mg/dL — AB (ref 0–149)
VLDL Cholesterol Cal: 36 mg/dL (ref 5–40)

## 2018-07-23 LAB — PSA: Prostate Specific Ag, Serum: 1.4 ng/mL (ref 0.0–4.0)

## 2018-09-06 DIAGNOSIS — R350 Frequency of micturition: Secondary | ICD-10-CM | POA: Diagnosis not present

## 2018-09-06 DIAGNOSIS — N401 Enlarged prostate with lower urinary tract symptoms: Secondary | ICD-10-CM | POA: Diagnosis not present

## 2018-10-16 DIAGNOSIS — N401 Enlarged prostate with lower urinary tract symptoms: Secondary | ICD-10-CM | POA: Diagnosis not present

## 2018-10-16 DIAGNOSIS — R351 Nocturia: Secondary | ICD-10-CM | POA: Diagnosis not present

## 2018-12-23 ENCOUNTER — Ambulatory Visit (INDEPENDENT_AMBULATORY_CARE_PROVIDER_SITE_OTHER): Payer: Commercial Managed Care - PPO | Admitting: Medical

## 2018-12-23 ENCOUNTER — Encounter: Payer: Self-pay | Admitting: Medical

## 2018-12-23 VITALS — BP 122/80 | HR 83 | Temp 98.1°F | Resp 18 | Wt 233.4 lb

## 2018-12-23 DIAGNOSIS — J988 Other specified respiratory disorders: Secondary | ICD-10-CM

## 2018-12-23 DIAGNOSIS — R05 Cough: Secondary | ICD-10-CM

## 2018-12-23 DIAGNOSIS — J3489 Other specified disorders of nose and nasal sinuses: Secondary | ICD-10-CM

## 2018-12-23 DIAGNOSIS — R059 Cough, unspecified: Secondary | ICD-10-CM

## 2018-12-23 MED ORDER — ALBUTEROL SULFATE HFA 108 (90 BASE) MCG/ACT IN AERS: 2.0000 | INHALATION_SPRAY | Freq: Four times a day (QID) | RESPIRATORY_TRACT | 0 refills | Status: AC | PRN

## 2018-12-23 MED ORDER — CLARITHROMYCIN 500 MG PO TABS: 500.0000 mg | ORAL_TABLET | Freq: Two times a day (BID) | ORAL | 0 refills | Status: DC

## 2018-12-23 NOTE — Progress Notes (Signed)
Subjective:  Jason Ibarra is a 62 y.o. male who presents for  Chief Complaint  Patient presents with  . cold    sinus pressure, headache, mucous, chest congestion-- x 1 week    Here for illness.  Has head and chest cold, not improving.  Has sinus pressure, lots of congestion.   Has had low grade fever.   Has had some body aches and chills.   No sore throat, no NVD.  Using ibuprofen and OTC sinus medication for symptoms. reports sick contacts.  Patient is not a smoker.  No other aggravating or relieving factors.  No other complaint.    Past Medical History:  Diagnosis Date  . Abdominal pain   . Dyslipidemia   . ED (erectile dysfunction)   . Hemorrhoids   . Herpes genitalis in men   . Hiatal hernia   . Hyperlipidemia    borderline per patient  . Rectal pain     Current Outpatient Medications on File Prior to Visit  Medication Sig Dispense Refill  . Multiple Vitamins-Minerals (MULTIVITAMIN WITH MINERALS) tablet Take 1 tablet by mouth daily.      . sildenafil (REVATIO) 20 MG tablet Take 1 tablet (20 mg total) by mouth 3 (three) times daily. 20 tablet 0  . valACYclovir (VALTREX) 500 MG tablet 1 tablet twice a day for 5 days at the onset of symptoms 20 tablet 5  . vitamin C (ASCORBIC ACID) 500 MG tablet Take 500 mg by mouth daily. Reported on 02/29/2016    . vitamin E 100 UNIT capsule Take 180 Units by mouth daily.     No current facility-administered medications on file prior to visit.     ROS as in subjective   Objective: BP 122/80   Pulse 83   Temp 98.1 F (36.7 C) (Oral)   Resp 18   Wt 233 lb 6.4 oz (105.9 kg)   SpO2 91%   BMI 30.79 kg/m   General appearance: Alert, well developed, well nourished, no distress                             Skin: warm, no rash                           Head: +mild sinus tenderness,                            Eyes: conjunctiva pink, corneas clear                            Ears: flat left tympanic membrane, flat right tympanic  membrane, external ear canals normal                          Nose: septum midline, turbinates swollen, with erythema and clear discharge             Mouth/throat: MMM, tongue normal, no pharyngeal erythema                           Neck: supple, no adenopathy, no thyromegaly, non tender                         Lungs: mildly decreased sounds/bronchial sounds, no wheezes, no  rales, no rhonchi        Assessment  Encounter Diagnoses  Name Primary?  Marland Kitchen Respiratory tract infection Yes  . Cough   . Sinus pressure       Plan: Discussed supportive care, rest, hydrate well, begin Biaxin.  He may try to use albuterol as discussed to help with coughing fits and tightness.  He can use 2 puffs 3 times daily of albuterol.  Advised to give [place] a call back and let us know how this works for him.  Patient was advised to call or return if worse or not improving in the next few days.    Patient voiced understanding of diagnosis, recommendations, and treatment plan.

## 2019-01-07 DIAGNOSIS — H43811 Vitreous degeneration, right eye: Secondary | ICD-10-CM | POA: Diagnosis not present

## 2019-01-07 DIAGNOSIS — H2513 Age-related nuclear cataract, bilateral: Secondary | ICD-10-CM | POA: Diagnosis not present

## 2019-01-07 DIAGNOSIS — H4423 Degenerative myopia, bilateral: Secondary | ICD-10-CM | POA: Diagnosis not present

## 2019-08-05 ENCOUNTER — Encounter: Payer: Self-pay | Admitting: Family Medicine

## 2019-08-05 ENCOUNTER — Other Ambulatory Visit: Payer: Self-pay

## 2019-08-05 ENCOUNTER — Ambulatory Visit (INDEPENDENT_AMBULATORY_CARE_PROVIDER_SITE_OTHER): Payer: Commercial Managed Care - PPO | Admitting: Family Medicine

## 2019-08-05 DIAGNOSIS — N400 Enlarged prostate without lower urinary tract symptoms: Secondary | ICD-10-CM

## 2019-08-05 DIAGNOSIS — F5221 Male erectile disorder: Secondary | ICD-10-CM | POA: Diagnosis not present

## 2019-08-05 DIAGNOSIS — Z86010 Personal history of colonic polyps: Secondary | ICD-10-CM

## 2019-08-05 DIAGNOSIS — E785 Hyperlipidemia, unspecified: Secondary | ICD-10-CM | POA: Diagnosis not present

## 2019-08-05 DIAGNOSIS — Z0000 Encounter for general adult medical examination without abnormal findings: Secondary | ICD-10-CM | POA: Diagnosis not present

## 2019-08-05 DIAGNOSIS — D229 Melanocytic nevi, unspecified: Secondary | ICD-10-CM

## 2019-08-05 LAB — POCT URINALYSIS DIP (PROADVANTAGE DEVICE)
Blood, UA: NEGATIVE
Glucose, UA: NEGATIVE mg/dL
Ketones, POC UA: NEGATIVE mg/dL
Leukocytes, UA: NEGATIVE
Nitrite, UA: NEGATIVE
Specific Gravity, Urine: 1.025
Urobilinogen, Ur: 0.2
pH, UA: 6 (ref 5.0–8.0)

## 2019-08-05 NOTE — Progress Notes (Signed)
   Subjective:    Patient ID: Jason Ibarra, male    DOB: 21-Nov-1956, 63 y.o.   MRN: [id number]  HPI He is here for complete examination.  He does have several moles that he would like me to look at.  He is also complaining of intermittent difficulty with night sweats that have been going on for the last 5 years.  He does have underlying ED as well as BPH.  He has tried some peripheral alpha blocking agents and did not find them useful.  He is also had to see urology for erectile dysfunction issues and did try injections but did not find them easy to do.  He does have a history of hyperlipidemia.  He also has a history of colonic polyps and is in a routine schedule for repeat colonoscopy.  Family and social history as well as health maintenance and immunizations was reviewed.   Review of Systems  All other systems reviewed and are negative.      Objective:   Physical Exam Alert and in no distress. Tympanic membranes and canals are normal. Pharyngeal area is normal. Neck is supple without adenopathy or thyromegaly. Cardiac exam shows a regular sinus rhythm without murmurs or gallops. Lungs are clear to auscultation. Abdominal exam shows no masses or tenderness.  2 moles are present on his chest.  Both appear with varying amounts of pigmentation but well demarcated.  There are both approximately 1 cm in size.       Assessment & Plan:  Routine general medical examination at a health care facility - Plan: POCT Urinalysis DIP (Proadvantage Device), CBC with Differential/Platelet, Comprehensive metabolic panel, Lipid panel,   Benign prostatic hyperplasia without lower urinary tract symptoms - Plan: Recommend he follow-up with urology concerning this.  Hyperlipidemia with target LDL less than 130 - Plan: Lipid panel,   ED (erectile dysfunction) of non-organic origin - Plan: Follow-up with urology  Personal history of colonic polyps - Plan: As per schedule  Numerous moles - Plan: He is to  set up an appointment in roughly 2 months for removal. He will need a Shingrix which we will give probably in a week and then in 2 months when he comes in for the mole removal, I will give the second Shingrix shot.

## 2019-08-06 LAB — CBC WITH DIFFERENTIAL/PLATELET
Basophils Absolute: 0.1 10*3/uL (ref 0.0–0.2)
Basos: 1 %
EOS (ABSOLUTE): 0.1 10*3/uL (ref 0.0–0.4)
Eos: 1 %
Hematocrit: 45.8 % (ref 37.5–51.0)
Hemoglobin: 15.5 g/dL (ref 13.0–17.7)
Immature Grans (Abs): 0 10*3/uL (ref 0.0–0.1)
Immature Granulocytes: 0 %
Lymphocytes Absolute: 1.9 10*3/uL (ref 0.7–3.1)
Lymphs: 23 %
MCH: 30.2 pg (ref 26.6–33.0)
MCHC: 33.8 g/dL (ref 31.5–35.7)
MCV: 89 fL (ref 79–97)
Monocytes Absolute: 0.8 10*3/uL (ref 0.1–0.9)
Monocytes: 10 %
Neutrophils Absolute: 5.4 10*3/uL (ref 1.4–7.0)
Neutrophils: 65 %
Platelets: 314 10*3/uL (ref 150–450)
RBC: 5.13 x10E6/uL (ref 4.14–5.80)
RDW: 13.3 % (ref 11.6–15.4)
WBC: 8.3 10*3/uL (ref 3.4–10.8)

## 2019-08-06 LAB — COMPREHENSIVE METABOLIC PANEL
ALT: 16 IU/L (ref 0–44)
AST: 19 IU/L (ref 0–40)
Albumin/Globulin Ratio: 1.4 (ref 1.2–2.2)
Albumin: 4.1 g/dL (ref 3.8–4.8)
Alkaline Phosphatase: 64 IU/L (ref 39–117)
BUN/Creatinine Ratio: 17 (ref 10–24)
BUN: 18 mg/dL (ref 8–27)
Bilirubin Total: 0.4 mg/dL (ref 0.0–1.2)
CO2: 22 mmol/L (ref 20–29)
Calcium: 9.4 mg/dL (ref 8.6–10.2)
Chloride: 105 mmol/L (ref 96–106)
Creatinine, Ser: 1.09 mg/dL (ref 0.76–1.27)
GFR calc Af Amer: 82 mL/min/{1.73_m2} (ref >59)
GFR calc non Af Amer: 71 mL/min/{1.73_m2} (ref >59)
Globulin, Total: 2.9 g/dL (ref 1.5–4.5)
Glucose: 92 mg/dL (ref 65–99)
Potassium: 4.4 mmol/L (ref 3.5–5.2)
Sodium: 142 mmol/L (ref 134–144)
Total Protein: 7 g/dL (ref 6.0–8.5)

## 2019-08-06 LAB — LIPID PANEL
Chol/HDL Ratio: 4.6 ratio (ref 0.0–5.0)
Cholesterol, Total: 188 mg/dL (ref 100–199)
HDL: 41 mg/dL (ref >39)
LDL Calculated: 120 mg/dL — ABNORMAL HIGH (ref 0–99)
Triglycerides: 133 mg/dL (ref 0–149)
VLDL Cholesterol Cal: 27 mg/dL (ref 5–40)

## 2019-08-12 ENCOUNTER — Other Ambulatory Visit: Payer: Self-pay

## 2019-08-12 ENCOUNTER — Telehealth: Payer: Self-pay | Admitting: Internal Medicine

## 2019-08-12 ENCOUNTER — Other Ambulatory Visit (INDEPENDENT_AMBULATORY_CARE_PROVIDER_SITE_OTHER): Payer: Commercial Managed Care - PPO

## 2019-08-12 DIAGNOSIS — Z23 Encounter for immunization: Secondary | ICD-10-CM | POA: Diagnosis not present

## 2019-08-12 NOTE — Telephone Encounter (Signed)
Pt came in for a shingles shot today and was interested in learning more about the covid-19 study going on.

## 2019-08-12 NOTE — Telephone Encounter (Signed)
Have him call [phone]

## 2019-08-12 NOTE — Telephone Encounter (Signed)
Pt was advised Jason Ibarra 

## 2019-10-13 ENCOUNTER — Other Ambulatory Visit: Payer: Self-pay

## 2019-10-13 ENCOUNTER — Encounter: Payer: Self-pay | Admitting: Family Medicine

## 2019-10-13 ENCOUNTER — Ambulatory Visit (INDEPENDENT_AMBULATORY_CARE_PROVIDER_SITE_OTHER): Payer: Commercial Managed Care - PPO | Admitting: Family Medicine

## 2019-10-13 VITALS — BP 120/82 | HR 58 | Temp 98.6°F | Wt 234.6 lb

## 2019-10-13 DIAGNOSIS — Z23 Encounter for immunization: Secondary | ICD-10-CM

## 2019-10-13 DIAGNOSIS — L989 Disorder of the skin and subcutaneous tissue, unspecified: Secondary | ICD-10-CM

## 2019-10-13 HISTORY — PX: SKIN BIOPSY: SHX1

## 2019-10-13 MED ORDER — LIDOCAINE-EPINEPHRINE 2 %-1:100000 IJ SOLN
1.7000 mL | Freq: Once | INTRAMUSCULAR | Status: AC
  Administered 2019-10-13: 1.7 mL via INTRADERMAL

## 2019-10-13 NOTE — Progress Notes (Signed)
   Subjective:    Patient ID: Jason Ibarra, male    DOB: 1956-01-05, 63 y.o.   MRN: [id number]  HPI He is here for excision of 2 lesions on his chest   Review of Systems     Objective:   Physical Exam 1 is the left upper chest area and the other 1 is the right mid abdominal area.  They are both roughly 1 cm in size and irregular in color as well as border.      Assessment & Plan:  Skin lesions - Plan: lidocaine-EPINEPHrine (XYLOCAINE W/EPI) 2 %-1:100000 (with pres) injection 1.7 mL  Need for influenza vaccination - Plan: Flu Vaccine QUAD 6+ mos PF IM (Fluarix Quad PF)  Need for shingles vaccine - Plan: Varicella-zoster vaccine IM (Shingrix) Both of the lesions were injected with Xylocaine and epinephrine.  6 mm punch biopsies was done on both of them with excision of the entire lesion.  Both lesions were Steri-Stripped with good result.

## 2019-10-21 ENCOUNTER — Encounter: Payer: Self-pay | Admitting: Family Medicine

## 2019-10-22 ENCOUNTER — Telehealth: Payer: Self-pay | Admitting: Family Medicine

## 2019-10-22 NOTE — Telephone Encounter (Signed)
Pt called wanting pathology results. Please call [phone].

## 2020-01-28 ENCOUNTER — Encounter (INDEPENDENT_AMBULATORY_CARE_PROVIDER_SITE_OTHER): Payer: Commercial Managed Care - PPO | Admitting: Ophthalmology

## 2020-01-28 DIAGNOSIS — H43813 Vitreous degeneration, bilateral: Secondary | ICD-10-CM

## 2020-01-28 DIAGNOSIS — H2513 Age-related nuclear cataract, bilateral: Secondary | ICD-10-CM | POA: Diagnosis not present

## 2020-01-28 DIAGNOSIS — H338 Other retinal detachments: Secondary | ICD-10-CM | POA: Diagnosis not present

## 2020-01-28 DIAGNOSIS — H33301 Unspecified retinal break, right eye: Secondary | ICD-10-CM | POA: Diagnosis not present

## 2020-02-04 ENCOUNTER — Encounter (INDEPENDENT_AMBULATORY_CARE_PROVIDER_SITE_OTHER): Payer: 59 | Admitting: Ophthalmology

## 2020-02-04 ENCOUNTER — Other Ambulatory Visit: Payer: Self-pay

## 2020-02-04 DIAGNOSIS — H33301 Unspecified retinal break, right eye: Secondary | ICD-10-CM

## 2020-05-11 ENCOUNTER — Ambulatory Visit (INDEPENDENT_AMBULATORY_CARE_PROVIDER_SITE_OTHER): Payer: 59 | Admitting: Family Medicine

## 2020-05-11 ENCOUNTER — Encounter: Payer: Self-pay | Admitting: Family Medicine

## 2020-05-11 ENCOUNTER — Ambulatory Visit: Payer: 59 | Admitting: Family Medicine

## 2020-05-11 ENCOUNTER — Other Ambulatory Visit: Payer: Self-pay

## 2020-05-11 VITALS — BP 102/64 | HR 86 | Temp 97.1°F | Wt 232.4 lb

## 2020-05-11 DIAGNOSIS — R350 Frequency of micturition: Secondary | ICD-10-CM

## 2020-05-11 DIAGNOSIS — N400 Enlarged prostate without lower urinary tract symptoms: Secondary | ICD-10-CM

## 2020-05-11 DIAGNOSIS — N410 Acute prostatitis: Secondary | ICD-10-CM | POA: Diagnosis not present

## 2020-05-11 LAB — POCT URINALYSIS DIP (PROADVANTAGE DEVICE)
Bilirubin, UA: NEGATIVE
Blood, UA: NEGATIVE
Glucose, UA: NEGATIVE mg/dL
Leukocytes, UA: NEGATIVE
Nitrite, UA: NEGATIVE
Protein Ur, POC: 30 mg/dL — AB
Specific Gravity, Urine: 1.02
Urobilinogen, Ur: 0.2
pH, UA: 6 (ref 5.0–8.0)

## 2020-05-11 MED ORDER — SULFAMETHOXAZOLE-TRIMETHOPRIM 800-160 MG PO TABS: 1.0000 | ORAL_TABLET | Freq: Two times a day (BID) | ORAL | 0 refills | Status: DC

## 2020-05-11 NOTE — Progress Notes (Signed)
   Subjective:    Patient ID: Jason Ibarra, male    DOB: 1956/03/07, 64 y.o.   MRN: [id number]  HPI He states that 3 days ago he developed fever, chills, urinary frequency as well as urgency.  He does have a previous history of BPH.  He has been controlling this through cutting back on alcohol which is made him have difficulty with nocturia.   Review of Systems     Objective:   Physical Exam Alert and slightly toxic appearing.  Abdominal exam shows no masses or tenderness.  Rectal exam does show a boggy, nontender prostate.  Urinalysis was negative.       Assessment & Plan:  Acute prostatitis - Plan: sulfamethoxazole-trimethoprim (BACTRIM DS) 800-160 MG tablet, Urine Culture  Frequency of urination - Plan: POCT Urinalysis DIP (Proadvantage Device)  Benign prostatic hyperplasia without lower urinary tract symptoms Some of his symptoms are bladder related although the urinalysis was negative.  I am going to do a culture just to be on the safe side.  I explained this to him and he is comfortable with that.

## 2020-05-12 LAB — URINE CULTURE: Organism ID, Bacteria: NO GROWTH

## 2020-05-13 ENCOUNTER — Encounter: Payer: Self-pay | Admitting: Family Medicine

## 2020-06-03 ENCOUNTER — Encounter (INDEPENDENT_AMBULATORY_CARE_PROVIDER_SITE_OTHER): Payer: 59 | Admitting: Ophthalmology

## 2020-06-03 ENCOUNTER — Other Ambulatory Visit: Payer: Self-pay

## 2020-06-03 DIAGNOSIS — H33301 Unspecified retinal break, right eye: Secondary | ICD-10-CM

## 2020-06-03 DIAGNOSIS — H43813 Vitreous degeneration, bilateral: Secondary | ICD-10-CM | POA: Diagnosis not present

## 2020-06-03 DIAGNOSIS — H338 Other retinal detachments: Secondary | ICD-10-CM | POA: Diagnosis not present

## 2020-08-13 ENCOUNTER — Encounter: Payer: 59 | Admitting: Family Medicine

## 2020-09-06 ENCOUNTER — Other Ambulatory Visit (INDEPENDENT_AMBULATORY_CARE_PROVIDER_SITE_OTHER): Payer: 59

## 2020-09-06 ENCOUNTER — Encounter: Payer: Self-pay | Admitting: Family Medicine

## 2020-09-06 ENCOUNTER — Other Ambulatory Visit: Payer: Self-pay

## 2020-09-06 ENCOUNTER — Telehealth (INDEPENDENT_AMBULATORY_CARE_PROVIDER_SITE_OTHER): Payer: 59 | Admitting: Family Medicine

## 2020-09-06 VITALS — Temp 98.8°F | Wt 232.0 lb

## 2020-09-06 DIAGNOSIS — R059 Cough, unspecified: Secondary | ICD-10-CM

## 2020-09-06 DIAGNOSIS — J069 Acute upper respiratory infection, unspecified: Secondary | ICD-10-CM

## 2020-09-06 DIAGNOSIS — R05 Cough: Secondary | ICD-10-CM

## 2020-09-06 DIAGNOSIS — Z20822 Contact with and (suspected) exposure to covid-19: Secondary | ICD-10-CM | POA: Diagnosis not present

## 2020-09-06 DIAGNOSIS — R0989 Other specified symptoms and signs involving the circulatory and respiratory systems: Secondary | ICD-10-CM | POA: Diagnosis not present

## 2020-09-06 LAB — POCT INFLUENZA A/B
Influenza A, POC: NEGATIVE
Influenza B, POC: NEGATIVE

## 2020-09-06 LAB — POC COVID19 BINAXNOW: SARS Coronavirus 2 Ag: NEGATIVE

## 2020-09-06 MED ORDER — CLARITHROMYCIN 500 MG PO TABS: 500.0000 mg | ORAL_TABLET | Freq: Two times a day (BID) | ORAL | 0 refills | Status: DC

## 2020-09-06 NOTE — Progress Notes (Signed)
   Subjective:    Patient ID: Jason Ibarra, male    DOB: 1956/03/13, 64 y.o.   MRN: [id number]  HPI I connected with  Jason Ibarra on 09/06/20 by a video enabled telemedicine application and verified that I am speaking with the correct person using two identifiers.  Caregility used.  I am in my office, he is at home alone. I discussed the limitations of evaluation and management by telemedicine. The patient expressed understanding and agreed to proceed. He flew down to [facility] on Tuesday and the next day he developed symptoms of sinus congestion, cough, headache, myalgias and some diarrhea.  Coughing, sinus congestion and myalgias have not improved and might be getting slightly worse.  He did have both of his Covid vaccines.  He did do a Covid test earlier today which was negative.  He states that every time he flies, he gets this and it requires an antibiotic.  This was last done in December 2019 and he was given Biaxin. Review of Systems     Objective:   Physical Exam  alert and visually in no distress otherwise not examined Covid test negative     Assessment & Plan:  Viral upper respiratory tract infection - Plan: clarithromycin (BIAXIN) 500 MG tablet  COVID-19 ruled out Will call in the Biaxin but will have him wait till tomorrow to get the PCR confirmation back.  If the test is negative, he will start on the antibiotic which has worked in the past. Recommend 2 Tylenol 4 times per day for aches and pains and he can also use as much as 800 mg 3 times daily of ibuprofen if he needs to.  He was comfortable with this.

## 2020-09-07 LAB — NOVEL CORONAVIRUS, NAA: SARS-CoV-2, NAA: NOT DETECTED

## 2020-09-07 LAB — SARS-COV-2, NAA 2 DAY TAT

## 2020-09-16 ENCOUNTER — Encounter: Payer: Self-pay | Admitting: Family Medicine

## 2020-09-16 ENCOUNTER — Ambulatory Visit (INDEPENDENT_AMBULATORY_CARE_PROVIDER_SITE_OTHER): Payer: 59 | Admitting: Family Medicine

## 2020-09-16 ENCOUNTER — Other Ambulatory Visit: Payer: Self-pay

## 2020-09-16 VITALS — BP 110/74 | HR 60 | Temp 97.1°F | Ht 72.0 in | Wt 226.0 lb

## 2020-09-16 DIAGNOSIS — E785 Hyperlipidemia, unspecified: Secondary | ICD-10-CM | POA: Diagnosis not present

## 2020-09-16 DIAGNOSIS — N400 Enlarged prostate without lower urinary tract symptoms: Secondary | ICD-10-CM

## 2020-09-16 DIAGNOSIS — M722 Plantar fascial fibromatosis: Secondary | ICD-10-CM

## 2020-09-16 DIAGNOSIS — Z23 Encounter for immunization: Secondary | ICD-10-CM

## 2020-09-16 DIAGNOSIS — F5221 Male erectile disorder: Secondary | ICD-10-CM

## 2020-09-16 DIAGNOSIS — K449 Diaphragmatic hernia without obstruction or gangrene: Secondary | ICD-10-CM

## 2020-09-16 DIAGNOSIS — Z86010 Personal history of colonic polyps: Secondary | ICD-10-CM | POA: Diagnosis not present

## 2020-09-16 DIAGNOSIS — Z0000 Encounter for general adult medical examination without abnormal findings: Secondary | ICD-10-CM | POA: Diagnosis not present

## 2020-09-16 NOTE — Progress Notes (Signed)
   Subjective:    Patient ID: Jason Ibarra, male    DOB: 06-27-1956, 64 y.o.   MRN: [id number]  HPI He is here for complete examination.  His main complaint today is right heel pain.  He thinks he might have plantar fasciitis.  He has been using a heel cup which has helped with his symptoms.  He also has an underlying history of ED as well as BPH.  He has tried various modalities including medications and injections none of which has been useful.  He does have some nocturia as well as hesitancy with some decreased stream but is not interested in further interventions in that.  Review of record indicates hiatus hernia however he is not having any reflux type symptoms.  He also has a history of hyperlipidemia.  Presently he is on no medications.  He does not smoke.  He does have a history of colonic polyps and is scheduled for routine follow-up on that.  He plans to retire in roughly 1 year.  Family and social history as well as health maintenance and immunizations was reviewed.   Review of Systems  All other systems reviewed and are negative.      Objective:   Physical Exam Alert and in no distress. Tympanic membranes and canals are normal. Pharyngeal area is normal. Neck is supple without adenopathy or thyromegaly. Cardiac exam shows a regular sinus rhythm without murmurs or gallops. Lungs are clear to auscultation.  Abdominal exam shows no masses or tenderness with normal bowel sounds.  Right foot exam does show tenderness to palpation over the calcaneal spur.  Full motion of the ankle.  No laxity noted.       Assessment & Plan:  Routine general medical examination at a health care facility - Plan: Comprehensive metabolic panel, CBC with Differential/Platelet, Lipid panel  Need for vaccination against Streptococcus pneumoniae - Plan: Pneumococcal conjugate vaccine 13-valent IM  Need for influenza vaccination - Plan: Flu Vaccine QUAD High Dose(Fluad)  Benign prostatic hyperplasia  without lower urinary tract symptoms  ED (erectile dysfunction) of non-organic origin  Hyperlipidemia with target LDL less than 130 - Plan: Lipid panel  Personal history of colonic polyps  Hiatus hernia syndrome  Plantar fasciitis of right foot   Information given concerning plantar fasciitis and therapy for this.  Discussed heel cord stretching, continue to use the heel cups possible use of anti-inflammatory.  If continued difficulty he will recheck with me for possible referral to physical therapy. Routine follow-up concerning the polyps. No therapy needed for the hiatus hernia since he is not having any trouble with that. At this point he is not interested in pursuing ED or BPH problems. I then discussed retirement plans indicating need for him to be intellectually, socially and personally connected. Discussed the fact that now he is 28 and I will probably place him on a statin drug based on risk.

## 2020-09-16 NOTE — Patient Instructions (Signed)
Plantar Fasciitis  Plantar fasciitis is a painful foot condition that affects the heel. It occurs when the band of tissue that connects the toes to the heel bone (plantar fascia) becomes irritated. This can happen as the result of exercising too much or doing other repetitive activities (overuse injury). The pain from plantar fasciitis can range from mild irritation to severe pain that makes it difficult to walk or move. The pain is usually worse in the morning after sleeping, or after sitting or lying down for a while. Pain may also be worse after long periods of walking or standing. What are the causes? This condition may be caused by:  Standing for long periods of time.  Wearing shoes that do not have good arch support.  Doing activities that put stress on joints (high-impact activities), including running, aerobics, and ballet.  Being overweight.  An abnormal way of walking (gait).  Tight muscles in the back of your lower leg (calf).  High arches in your feet.  Starting a new athletic activity. What are the signs or symptoms? The main symptom of this condition is heel pain. Pain may:  Be worse with first steps after a time of rest, especially in the morning after sleeping or after you have been sitting or lying down for a while.  Be worse after long periods of standing still.  Decrease after 30-45 minutes of activity, such as gentle walking. How is this diagnosed? This condition may be diagnosed based on your medical history and your symptoms. Your health care provider may ask questions about your activity level. Your health care provider will do a physical exam to check for:  A tender area on the bottom of your foot.  A high arch in your foot.  Pain when you move your foot.  Difficulty moving your foot. You may have imaging tests to confirm the diagnosis, such as:  X-rays.  Ultrasound.  MRI. How is this treated? Treatment for plantar fasciitis depends on how  severe your condition is. Treatment may include:  Rest, ice, applying pressure (compression), and raising the affected foot (elevation). This may be called RICE therapy. Your health care provider may recommend RICE therapy along with over-the-counter pain medicines to manage your pain.  Exercises to stretch your calves and your plantar fascia.  A splint that holds your foot in a stretched, upward position while you sleep (night splint).  Physical therapy to relieve symptoms and prevent problems in the future.  Injections of steroid medicine (cortisone) to relieve pain and inflammation.  Stimulating your plantar fascia with electrical impulses (extracorporeal shock wave therapy). This is usually the last treatment option before surgery.  Surgery, if other treatments have not worked after 12 months. Follow these instructions at home:  Managing pain, stiffness, and swelling  If directed, put ice on the painful area: ? Put ice in a plastic bag, or use a frozen bottle of water. ? Place a towel between your skin and the bag or bottle. ? Roll the bottom of your foot over the bag or bottle. ? Do this for 20 minutes, 2-3 times a day.  Wear athletic shoes that have air-sole or gel-sole cushions, or try wearing soft shoe inserts that are designed for plantar fasciitis.  Raise (elevate) your foot above the level of your heart while you are sitting or lying down. Activity  Avoid activities that cause pain. Ask your health care provider what activities are safe for you.  Do physical therapy exercises and stretches as told   by your health care provider.  Try activities and forms of exercise that are easier on your joints (low-impact). Examples include swimming, water aerobics, and biking. General instructions  Take over-the-counter and prescription medicines only as told by your health care provider.  Wear a night splint while sleeping, if told by your health care provider. Loosen the splint  if your toes tingle, become numb, or turn cold and blue.  Maintain a healthy weight, or work with your health care provider to lose weight as needed.  Keep all follow-up visits as told by your health care provider. This is important. Contact a health care provider if you:  Have symptoms that do not go away after caring for yourself at home.  Have pain that gets worse.  Have pain that affects your ability to move or do your daily activities. Summary  Plantar fasciitis is a painful foot condition that affects the heel. It occurs when the band of tissue that connects the toes to the heel bone (plantar fascia) becomes irritated.  The main symptom of this condition is heel pain that may be worse after exercising too much or standing still for a long time.  Treatment varies, but it usually starts with rest, ice, compression, and elevation (RICE therapy) and over-the-counter medicines to manage pain. This information is not intended to replace advice given to you by your health care provider. Make sure you discuss any questions you have with your health care provider. Document Revised: 11/23/2017 Document Reviewed: 10/08/2017 Elsevier Patient Education  2020 [employer]  

## 2020-09-17 ENCOUNTER — Telehealth: Payer: Self-pay

## 2020-09-17 LAB — COMPREHENSIVE METABOLIC PANEL
ALT: 17 IU/L (ref 0–44)
AST: 15 IU/L (ref 0–40)
Albumin/Globulin Ratio: 1.5 (ref 1.2–2.2)
Albumin: 4.1 g/dL (ref 3.8–4.8)
Alkaline Phosphatase: 71 IU/L (ref 44–121)
BUN/Creatinine Ratio: 14 (ref 10–24)
BUN: 15 mg/dL (ref 8–27)
Bilirubin Total: 0.5 mg/dL (ref 0.0–1.2)
CO2: 25 mmol/L (ref 20–29)
Calcium: 9.2 mg/dL (ref 8.6–10.2)
Chloride: 103 mmol/L (ref 96–106)
Creatinine, Ser: 1.09 mg/dL (ref 0.76–1.27)
GFR calc Af Amer: 82 mL/min/{1.73_m2} (ref >59)
GFR calc non Af Amer: 71 mL/min/{1.73_m2} (ref >59)
Globulin, Total: 2.8 g/dL (ref 1.5–4.5)
Glucose: 94 mg/dL (ref 65–99)
Potassium: 4.5 mmol/L (ref 3.5–5.2)
Sodium: 142 mmol/L (ref 134–144)
Total Protein: 6.9 g/dL (ref 6.0–8.5)

## 2020-09-17 LAB — CBC WITH DIFFERENTIAL/PLATELET
Basophils Absolute: 0 10*3/uL (ref 0.0–0.2)
Basos: 1 %
EOS (ABSOLUTE): 0.2 10*3/uL (ref 0.0–0.4)
Eos: 2 %
Hematocrit: 46.3 % (ref 37.5–51.0)
Hemoglobin: 15.8 g/dL (ref 13.0–17.7)
Immature Grans (Abs): 0.1 10*3/uL (ref 0.0–0.1)
Immature Granulocytes: 1 %
Lymphocytes Absolute: 2 10*3/uL (ref 0.7–3.1)
Lymphs: 27 %
MCH: 30.2 pg (ref 26.6–33.0)
MCHC: 34.1 g/dL (ref 31.5–35.7)
MCV: 89 fL (ref 79–97)
Monocytes Absolute: 0.8 10*3/uL (ref 0.1–0.9)
Monocytes: 11 %
Neutrophils Absolute: 4.3 10*3/uL (ref 1.4–7.0)
Neutrophils: 58 %
Platelets: 341 10*3/uL (ref 150–450)
RBC: 5.23 x10E6/uL (ref 4.14–5.80)
RDW: 13.5 % (ref 11.6–15.4)
WBC: 7.3 10*3/uL (ref 3.4–10.8)

## 2020-09-17 LAB — LIPID PANEL
Chol/HDL Ratio: 5.8 ratio — ABNORMAL HIGH (ref 0.0–5.0)
Cholesterol, Total: 192 mg/dL (ref 100–199)
HDL: 33 mg/dL — ABNORMAL LOW (ref >39)
LDL Chol Calc (NIH): 125 mg/dL — ABNORMAL HIGH (ref 0–99)
Triglycerides: 190 mg/dL — ABNORMAL HIGH (ref 0–149)
VLDL Cholesterol Cal: 34 mg/dL (ref 5–40)

## 2020-09-17 NOTE — Telephone Encounter (Signed)
LVM for pt to either view on my chart or call office for labs [facility]

## 2020-10-05 ENCOUNTER — Other Ambulatory Visit: Payer: Self-pay

## 2020-10-05 ENCOUNTER — Ambulatory Visit (INDEPENDENT_AMBULATORY_CARE_PROVIDER_SITE_OTHER): Payer: 59

## 2020-10-05 DIAGNOSIS — Z23 Encounter for immunization: Secondary | ICD-10-CM

## 2021-04-01 ENCOUNTER — Telehealth: Payer: Self-pay | Admitting: Family Medicine

## 2021-04-01 NOTE — Telephone Encounter (Signed)
Pt called and wants to ask you some recommendations for someone that can help his step son. He is gay,HIV positive and uses Meth. I wanted to see if you wanted me to add Charlie to your schedule next week for a virtual so you can discuss this with him

## 2021-09-20 ENCOUNTER — Ambulatory Visit (INDEPENDENT_AMBULATORY_CARE_PROVIDER_SITE_OTHER): Payer: Medicare Other | Admitting: Family Medicine

## 2021-09-20 ENCOUNTER — Other Ambulatory Visit: Payer: Self-pay

## 2021-09-20 VITALS — BP 120/84 | HR 58 | Temp 97.2°F | Ht 72.5 in | Wt 228.6 lb

## 2021-09-20 DIAGNOSIS — F5221 Male erectile disorder: Secondary | ICD-10-CM

## 2021-09-20 DIAGNOSIS — E785 Hyperlipidemia, unspecified: Secondary | ICD-10-CM | POA: Diagnosis not present

## 2021-09-20 DIAGNOSIS — N400 Enlarged prostate without lower urinary tract symptoms: Secondary | ICD-10-CM | POA: Diagnosis not present

## 2021-09-20 DIAGNOSIS — Z8619 Personal history of other infectious and parasitic diseases: Secondary | ICD-10-CM | POA: Diagnosis not present

## 2021-09-20 DIAGNOSIS — G43109 Migraine with aura, not intractable, without status migrainosus: Secondary | ICD-10-CM

## 2021-09-20 DIAGNOSIS — Z0000 Encounter for general adult medical examination without abnormal findings: Secondary | ICD-10-CM

## 2021-09-20 DIAGNOSIS — Z86010 Personal history of colonic polyps: Secondary | ICD-10-CM

## 2021-09-20 DIAGNOSIS — Z23 Encounter for immunization: Secondary | ICD-10-CM | POA: Diagnosis not present

## 2021-09-20 DIAGNOSIS — J209 Acute bronchitis, unspecified: Secondary | ICD-10-CM

## 2021-09-20 LAB — CBC WITH DIFFERENTIAL/PLATELET
Basophils Absolute: 0.1 10*3/uL (ref 0.0–0.2)
Basos: 1 %
EOS (ABSOLUTE): 0.2 10*3/uL (ref 0.0–0.4)
Eos: 2 %
Hematocrit: 44.7 % (ref 37.5–51.0)
Hemoglobin: 15.5 g/dL (ref 13.0–17.7)
Immature Grans (Abs): 0.1 10*3/uL (ref 0.0–0.1)
Immature Granulocytes: 1 %
Lymphocytes Absolute: 1.7 10*3/uL (ref 0.7–3.1)
Lymphs: 22 %
MCH: 30.1 pg (ref 26.6–33.0)
MCHC: 34.7 g/dL (ref 31.5–35.7)
MCV: 87 fL (ref 79–97)
Monocytes Absolute: 0.8 10*3/uL (ref 0.1–0.9)
Monocytes: 10 %
Neutrophils Absolute: 5 10*3/uL (ref 1.4–7.0)
Neutrophils: 64 %
Platelets: 371 10*3/uL (ref 150–450)
RBC: 5.15 x10E6/uL (ref 4.14–5.80)
RDW: 13.2 % (ref 11.6–15.4)
WBC: 7.7 10*3/uL (ref 3.4–10.8)

## 2021-09-20 LAB — COMPREHENSIVE METABOLIC PANEL
ALT: 14 IU/L (ref 0–44)
AST: 16 IU/L (ref 0–40)
Albumin/Globulin Ratio: 1.5 (ref 1.2–2.2)
Albumin: 4.1 g/dL (ref 3.8–4.8)
Alkaline Phosphatase: 77 IU/L (ref 44–121)
BUN/Creatinine Ratio: 13 (ref 10–24)
BUN: 13 mg/dL (ref 8–27)
Bilirubin Total: 0.6 mg/dL (ref 0.0–1.2)
CO2: 24 mmol/L (ref 20–29)
Calcium: 8.9 mg/dL (ref 8.6–10.2)
Chloride: 105 mmol/L (ref 96–106)
Creatinine, Ser: 1.01 mg/dL (ref 0.76–1.27)
Globulin, Total: 2.8 g/dL (ref 1.5–4.5)
Glucose: 106 mg/dL — ABNORMAL HIGH (ref 70–99)
Potassium: 4.4 mmol/L (ref 3.5–5.2)
Sodium: 143 mmol/L (ref 134–144)
Total Protein: 6.9 g/dL (ref 6.0–8.5)
eGFR: 82 mL/min/{1.73_m2} (ref >59)

## 2021-09-20 LAB — LIPID PANEL
Chol/HDL Ratio: 5.2 ratio — ABNORMAL HIGH (ref 0.0–5.0)
Cholesterol, Total: 192 mg/dL (ref 100–199)
HDL: 37 mg/dL — ABNORMAL LOW (ref >39)
LDL Chol Calc (NIH): 120 mg/dL — ABNORMAL HIGH (ref 0–99)
Triglycerides: 196 mg/dL — ABNORMAL HIGH (ref 0–149)
VLDL Cholesterol Cal: 35 mg/dL (ref 5–40)

## 2021-09-20 MED ORDER — VALACYCLOVIR HCL 500 MG PO TABS: ORAL_TABLET | ORAL | 5 refills | Status: AC

## 2021-09-20 MED ORDER — AZITHROMYCIN 500 MG PO TABS: 500.0000 mg | ORAL_TABLET | Freq: Every day | ORAL | 0 refills | Status: DC

## 2021-09-20 NOTE — Progress Notes (Signed)
Jason Ibarra is a 65 y.o. male who presents for welcome to Medicare visit and follow-up on chronic medical conditions.  He has had a recent COVID diagnosis on September 11.  He states that he is roughly 80 to 90% better but notes that in the last several days his coughing has gotten worse.  He also notes that he was contacted by Dr. Benson Norway to repeat his colonoscopy.  His allergies seem to be under good control.  He rarely has asthma and still has an inhaler but states that he sometimes forgets to even use it.  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  He states that over the last 10 years he has had trouble with migraine headaches.  He has learned that when he gets the visual aura, using 800 mg of ibuprofen does abort the headache and he plans to continue to do this.  He gets them maybe once per month.  He does have underlying ED and has tried pills as well as injections but at this point he has resigned himself to not try and interact any further with urology concerning this.  He does complain of cold feet and some slight swelling in his ankles.  He also has concerns about his posture.   Immunizations and Health Maintenance Immunization History  Administered Date(s) Administered   DTaP 08/17/1998   Fluad Quad(high Dose 65+) 09/16/2020, 09/20/2021   Influenza Split 09/08/2011, 11/08/2012, 10/11/2015   Influenza,inj,Quad PF,6+ Mos 10/13/2019   PFIZER(Purple Top)SARS-COV-2 Vaccination 03/10/2020, 03/31/2020, 10/05/2020   Pneumococcal Conjugate-13 09/16/2020   Pneumococcal Polysaccharide-23 09/20/2021   Tdap 02/23/2007, 07/04/2017   Zoster Recombinat (Shingrix) 08/12/2019, 10/13/2019   Zoster, Live 07/01/2015   Health Maintenance Due  Topic Date Due   COVID-19 Vaccine (4 - Booster for [place] series) 12/28/2020   COLONOSCOPY (Pts 45-38yrs Insurance coverage will need to be confirmed)  08/03/2021    Last colonoscopy: 08/03/16  Last  PSA: 07/22/18 level 1.4 Dentist: Q six months Ophtho: Q year Exercise: yard work and golf about two days a week  Other doctors caring for patient include:Dr. Benson Norway GI, Dr. Katy Fitch eye   Advanced Directives: Does Patient Have a Medical Advance Directive?: Yes Type of Advance Directive: Living will, [place] Does patient want to make changes to medical advance directive?: No - Patient declined Copy of [place] in Chart?: No - copy requested  Depression screen:  See questionnaire below.     Depression screen [facility] 2/9 09/20/2021 10/13/2019 08/05/2019 07/22/2018 07/04/2017  Decreased Interest 0 0 0 0 0  Down, Depressed, Hopeless 0 0 0 0 0  PHQ - 2 Score 0 0 0 0 0    Fall Screen: See Questionaire below.   Fall Risk  09/20/2021 09/16/2020 07/22/2018 07/04/2017 07/03/2016  Falls in the past year? 0 0 No No No  Number falls in past yr: 0 - - - -  Injury with Fall? 0 - - - -  Follow up Falls evaluation completed - - - -    ADL screen:  See questionnaire below.  Functional Status Survey: Is the patient deaf or have difficulty hearing?: No Does the patient have difficulty seeing, even when wearing glasses/contacts?: No Does the patient have difficulty concentrating, remembering, or making decisions?: No Does the patient have difficulty walking or climbing stairs?: No Does the patient have difficulty dressing or bathing?: No Does the patient have difficulty doing errands alone  such as visiting a doctor's office or shopping?: No   Review of Systems  Constitutional: -, -unexpected weight change, -anorexia, -fatigue Allergy: -sneezing, -itching, -congestion Dermatology: denies changing moles, rash, lumps ENT: -runny nose, -ear pain, -sore throat,  Cardiology:  -chest pain, -palpitations, -orthopnea, Respiratory: -cough, -shortness of breath, -dyspnea on exertion, -wheezing,  Gastroenterology: -abdominal pain, -nausea, -vomiting, -diarrhea, -constipation,  -dysphagia Hematology: -bleeding or bruising problems Musculoskeletal: -arthralgias, -myalgias, -joint swelling, -back pain, - Ophthalmology: -vision changes,  Urology: -dysuria, -difficulty urinating,  -urinary frequency, -urgency, incontinence Neurology: -, -numbness, , -memory loss, -falls, -dizziness    PHYSICAL EXAM:   General Appearance: Alert, cooperative, no distress, appears stated age Head: Normocephalic, without obvious abnormality, atraumatic Eyes: PERRL, conjunctiva/corneas clear, EOM's intact,  Ears: Normal TM's and external ear canals Nose: Nares normal, mucosa normal, no drainage or sinus   tenderness Throat: Lips, mucosa, and tongue normal; teeth and gums normal Neck: Supple, no lymphadenopathy, thyroid:no enlargement/tenderness/nodules; no carotid bruit or JVD Lungs: Clear to auscultation bilaterally without wheezes, rales or ronchi; respirations unlabored Heart: Regular rate and rhythm, S1 and S2 normal, no murmur, rub or gallop Abdomen: Soft, non-tender, nondistended, normoactive bowel sounds, no masses, no hepatosplenomegaly Extremities: No clubbing, cyanosis or edema Pulses: 2+ and symmetric all extremities Skin: Skin color, texture, turgor normal, no rashes or lesions Lymph nodes: Cervical, supraclavicular, and axillary nodes normal Neurologic: CNII-XII intact, normal strength, sensation and gait; reflexes 2+ and symmetric throughout   Psych: Normal mood, affect, hygiene and grooming Hearing test is recorded. ASSESSMENT/PLAN: Encounter for initial annual wellness visit (AWV) in Medicare patient  Need for influenza vaccination - Plan: Flu Vaccine QUAD High Dose(Fluad), CANCELED: Flu vaccine HIGH DOSE PF (Fluzone High dose)  Migraine with aura and without status migrainosus, not intractable: He is handling the migraine headaches appropriately and I recommend he continue.  Need for vaccination against Streptococcus pneumoniae - Plan: Pneumococcal polysaccharide  vaccine 23-valent greater than or equal to 2yo subcutaneous/IM  Benign prostatic hyperplasia without lower urinary tract symptoms: Presently he is having no difficulty with that.  ED (erectile dysfunction) of non-organic origin: He is not interested in any further intervention.  Hyperlipidemia with target LDL less than 130 - Plan: Lipid panel  Personal history of colonic polyps - Plan: Ambulatory referral to Gastroenterology  History of herpes genitalis - Plan: valACYclovir (VALTREX) 500 MG tablet  Acute bronchitis, unspecified organism - Plan: azithromycin (ZITHROMAX) 500 MG tablet, CBC with Differential/Platelet, Comprehensive metabolic panel Since his coughing is getting worse I think it is reasonable to treat his post COVID coughing with an antibiotic.   Discussed PSA screening (risks/benefits), recommended at least 30 minutes of aerobic activity at least 5 days/week;  Immunization recommendations discussed.  Colonoscopy recommendations reviewed.   Medicare Attestation I have personally reviewed: The patient's medical and social history Their use of alcohol, tobacco or illicit drugs Their current medications and supplements The patient's functional ability including ADLs,fall risks, home safety risks, cognitive, and hearing and visual impairment Diet and physical activities Evidence for depression or mood disorders  The patient's weight, height, and BMI have been recorded in the chart.  I have made referrals, counseling, and provided education to the patient based on review of the above and I have provided the patient with a written personalized care plan for preventive services.     Jill Alexanders, MD   09/20/2021

## 2021-09-22 ENCOUNTER — Other Ambulatory Visit: Payer: Self-pay

## 2021-09-22 DIAGNOSIS — Z13220 Encounter for screening for lipoid disorders: Secondary | ICD-10-CM

## 2021-09-22 MED ORDER — ATORVASTATIN CALCIUM 20 MG PO TABS: 20.0000 mg | ORAL_TABLET | Freq: Every day | ORAL | 3 refills | Status: DC

## 2021-09-22 NOTE — Addendum Note (Signed)
Addended by: Denita Lung on: 09/22/2021 12:50 PM   Modules accepted: Orders

## 2021-09-26 DIAGNOSIS — K625 Hemorrhage of anus and rectum: Secondary | ICD-10-CM | POA: Diagnosis not present

## 2021-09-26 DIAGNOSIS — Z86010 Personal history of colonic polyps: Secondary | ICD-10-CM | POA: Diagnosis not present

## 2021-09-26 DIAGNOSIS — K219 Gastro-esophageal reflux disease without esophagitis: Secondary | ICD-10-CM | POA: Diagnosis not present

## 2021-10-18 DIAGNOSIS — D125 Benign neoplasm of sigmoid colon: Secondary | ICD-10-CM | POA: Diagnosis not present

## 2021-10-18 DIAGNOSIS — Z86010 Personal history of colonic polyps: Secondary | ICD-10-CM | POA: Diagnosis not present

## 2021-10-18 LAB — HM COLONOSCOPY

## 2021-10-24 ENCOUNTER — Other Ambulatory Visit: Payer: Medicare Other

## 2021-10-24 ENCOUNTER — Other Ambulatory Visit: Payer: Self-pay

## 2021-10-24 DIAGNOSIS — Z13220 Encounter for screening for lipoid disorders: Secondary | ICD-10-CM

## 2021-10-24 DIAGNOSIS — E785 Hyperlipidemia, unspecified: Secondary | ICD-10-CM | POA: Diagnosis not present

## 2021-10-24 LAB — LIPID PANEL
Chol/HDL Ratio: 5.1 ratio — ABNORMAL HIGH (ref 0.0–5.0)
Cholesterol, Total: 185 mg/dL (ref 100–199)
HDL: 36 mg/dL — ABNORMAL LOW (ref >39)
LDL Chol Calc (NIH): 121 mg/dL — ABNORMAL HIGH (ref 0–99)
Triglycerides: 154 mg/dL — ABNORMAL HIGH (ref 0–149)
VLDL Cholesterol Cal: 28 mg/dL (ref 5–40)

## 2021-11-25 ENCOUNTER — Ambulatory Visit
Admission: RE | Admit: 2021-11-25 | Discharge: 2021-11-25 | Disposition: A | Discharge status: Home / Self Care | Payer: Medicare Other | Source: Ambulatory Visit | Attending: Family Medicine | Admitting: Family Medicine

## 2021-11-25 ENCOUNTER — Other Ambulatory Visit: Payer: Self-pay

## 2021-11-25 ENCOUNTER — Ambulatory Visit (INDEPENDENT_AMBULATORY_CARE_PROVIDER_SITE_OTHER): Payer: Medicare Other | Admitting: Family Medicine

## 2021-11-25 ENCOUNTER — Encounter: Payer: Self-pay | Admitting: Family Medicine

## 2021-11-25 VITALS — BP 116/74 | HR 61 | Temp 97.4°F | Wt 223.2 lb

## 2021-11-25 DIAGNOSIS — M542 Cervicalgia: Secondary | ICD-10-CM | POA: Diagnosis not present

## 2021-11-25 IMAGING — DX DG CERVICAL SPINE COMPLETE 4+V
6 series · 6 of 6 positions shown · non-contrast
Comparison: None.

CLINICAL DATA: Neck pain.

EXAM:
CERVICAL SPINE - COMPLETE 4+ VIEW

[dg cervical spine complete (1 of 6)]
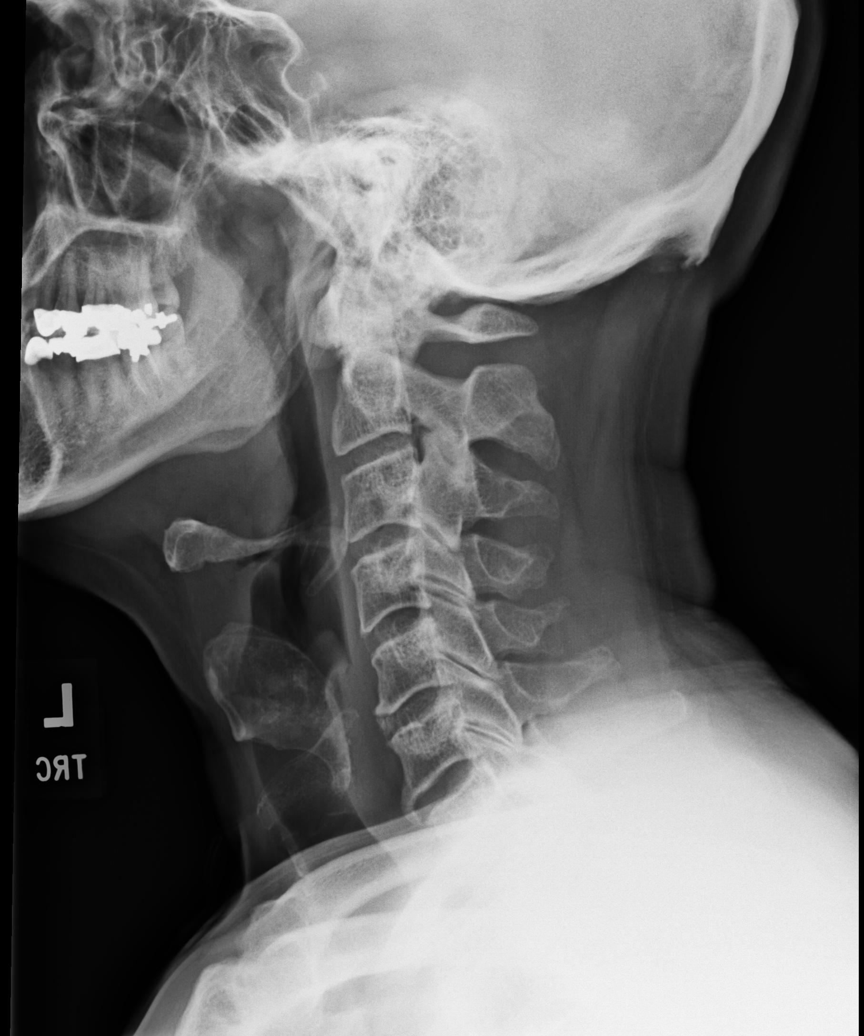

[dg cervical spine complete (2 of 6)]
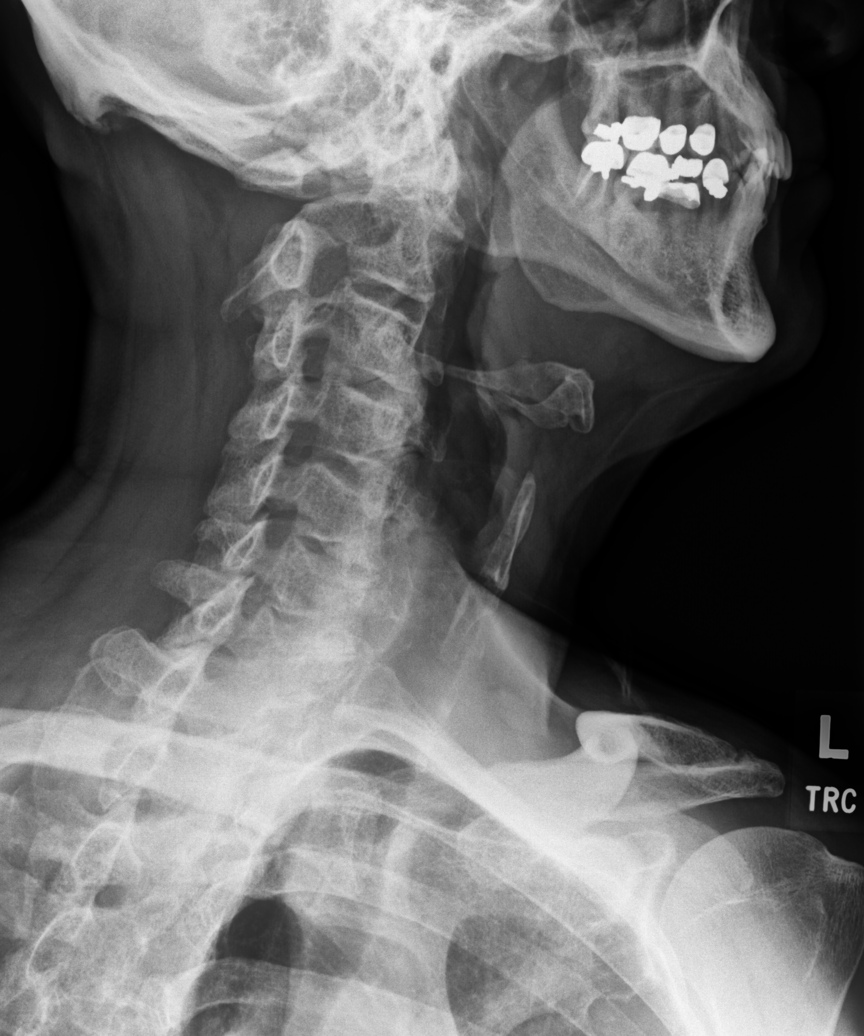

[dg cervical spine complete (3 of 6)]
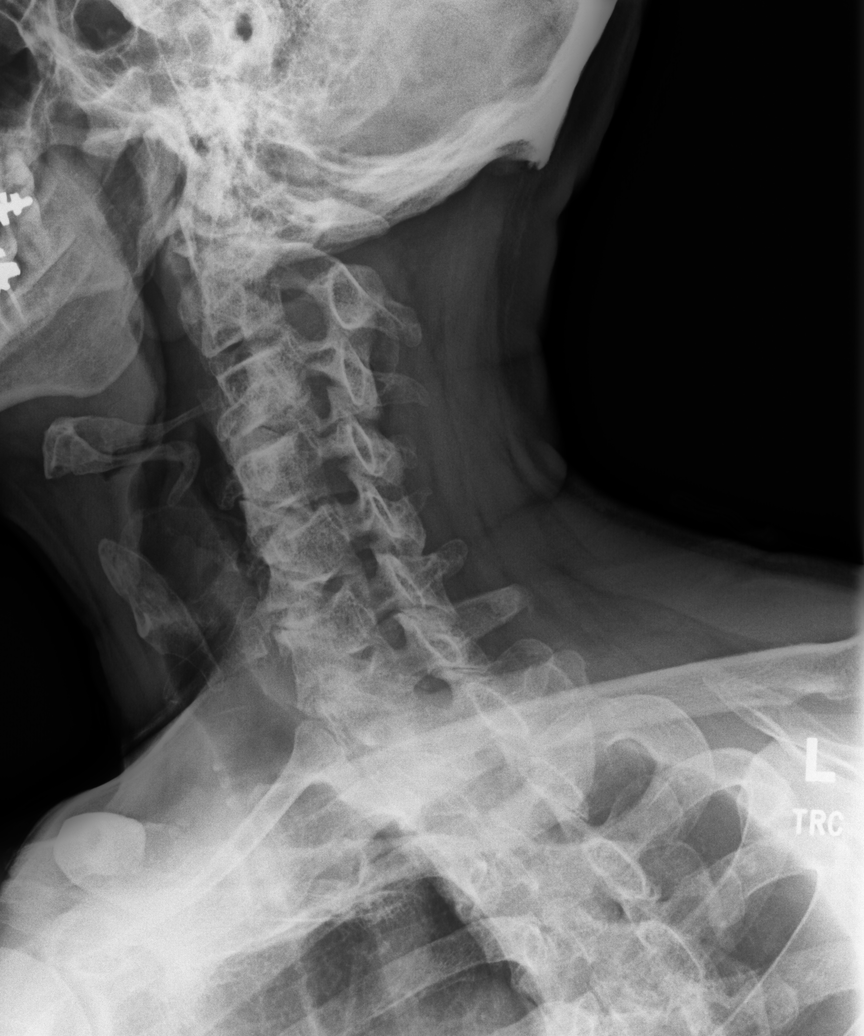

[dg cervical spine complete (4 of 6)]
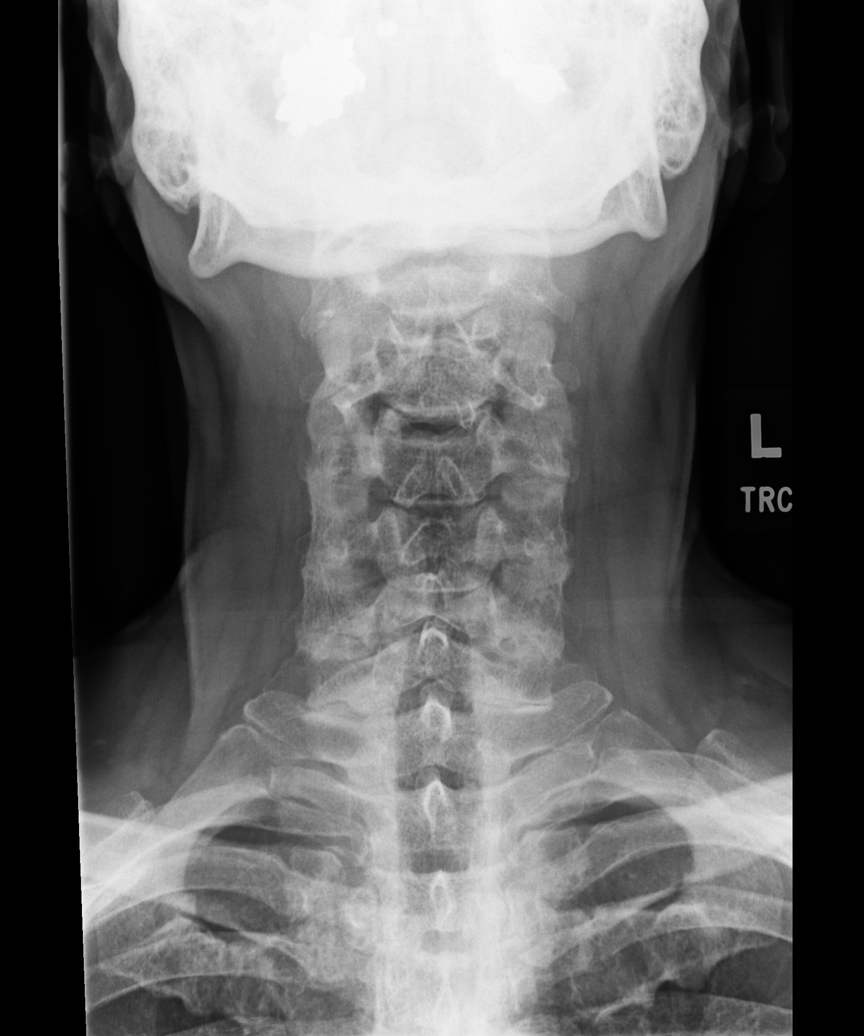

[dg cervical spine complete (5 of 6)]
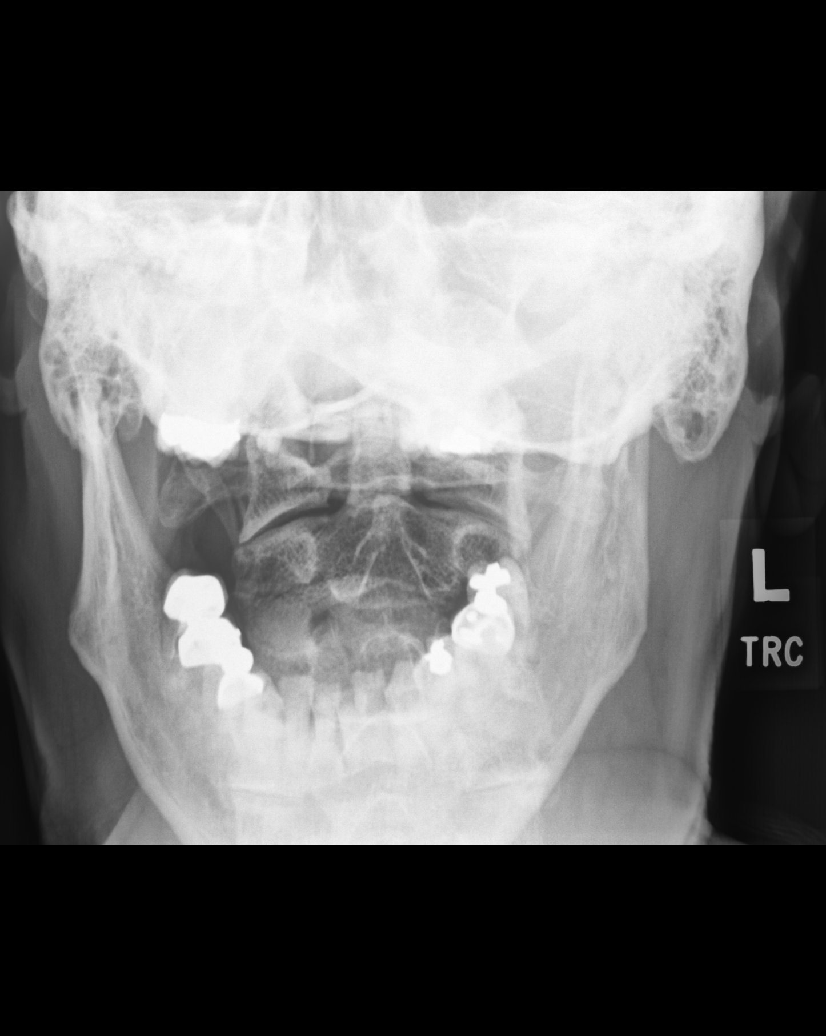

[dg cervical spine complete (6 of 6)]
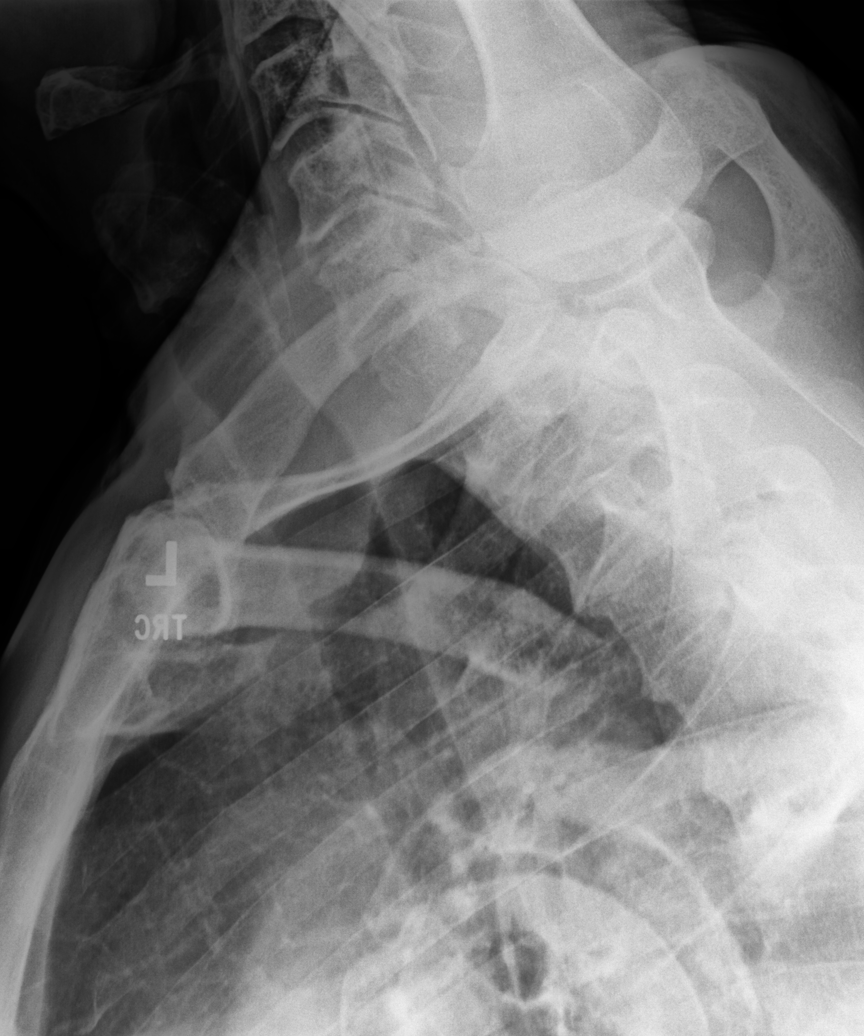

[6 of 6 positions shown; findings below may reference images not displayed]

FINDINGS: There is no evidence of cervical spine fracture or prevertebral soft
tissue swelling. Alignment is normal. No other significant bone
abnormalities are identified.
IMPRESSION: Negative cervical spine radiographs.

## 2021-11-25 NOTE — Progress Notes (Signed)
   Subjective:    Patient ID: Jason Ibarra, male    DOB: 12/02/1956, 65 y.o.   MRN: [id number]  HPI He complains of a 55-month history of right-sided neck pain.  He notes that it is worse when he moves in the left lateral position.  He started to note some referred pain into the shoulder.  No weakness, numbness or tingling noted.   Review of Systems     Objective:   Physical Exam No palpable tenderness to the neck.  Normal motor, sensory and DTRs.       Assessment & Plan:  Neck pain - Plan: DG Cervical Spine Complete I explained that I will do an x-ray which will probably show arthritic changes and and refer to physical therapy.  If he continues have difficulty, further evaluation including MRI and possible referral will be needed.

## 2021-11-29 ENCOUNTER — Other Ambulatory Visit: Payer: Self-pay

## 2021-11-29 DIAGNOSIS — M542 Cervicalgia: Secondary | ICD-10-CM

## 2021-12-07 ENCOUNTER — Other Ambulatory Visit: Payer: Self-pay

## 2021-12-07 ENCOUNTER — Encounter: Payer: Self-pay | Admitting: Rehabilitative and Restorative Service Providers"

## 2021-12-07 ENCOUNTER — Ambulatory Visit: Payer: Medicare Other | Admitting: Rehabilitative and Restorative Service Providers"

## 2021-12-07 DIAGNOSIS — R293 Abnormal posture: Secondary | ICD-10-CM | POA: Diagnosis not present

## 2021-12-07 DIAGNOSIS — R600 Localized edema: Secondary | ICD-10-CM

## 2021-12-07 DIAGNOSIS — M542 Cervicalgia: Secondary | ICD-10-CM | POA: Diagnosis not present

## 2021-12-07 DIAGNOSIS — M6281 Muscle weakness (generalized): Secondary | ICD-10-CM | POA: Diagnosis not present

## 2021-12-07 NOTE — Therapy (Signed)
[facility] [address] [place], [place], [address] Phone: [phone]   Fax:  [phone]  Physical Therapy Evaluation  Patient Details  Name: Jason Ibarra MRN: [id number] Date of Birth: [birth year] Referring Provider (PT): Denita Lung MD   Encounter Date: 12/07/2021   PT End of Session - 12/07/21 1817     Visit Number 1    Number of Visits 10    Date for PT Re-Evaluation 01/18/22    Progress Note Due on Visit 10    PT Start Time 0930    PT Stop Time 6599    PT Time Calculation (min) 45 min    Activity Tolerance Patient tolerated treatment well;No increased pain    Behavior During Therapy WFL for tasks assessed/performed             Past Medical History:  Diagnosis Date   Abdominal pain    Dyslipidemia    ED (erectile dysfunction)    Hemorrhoids    Herpes genitalis in men    Hiatal hernia    Hyperlipidemia    borderline per patient   Rectal pain     Past Surgical History:  Procedure Laterality Date   RETINAL DETACHMENT [place]   SKIN BIOPSY Left 10/13/2019   left clavicle pigmented seborrheic keratosis   SKIN BIOPSY Right 10/13/2019   right miid chest  pigmented seborrheic keratosis   TONSILLECTOMY  1962    There were no vitals filed for this visit.    Subjective Assessment - 12/07/21 1813     Subjective Jason Ibarra has had off and on neck pain with symptoms becoming increasingly more painful about 2 months ago.  Symptoms are more at the base of the skull.  He would like to be able to return to his normal activities (golf) without restrictions.    Pertinent History Previous RTC repair    Limitations Sitting;Reading;Lifting;House hold activities    How long can you sit comfortably? 30 minutes    Patient Stated Goals Return to golf and full function without being limited by neck pain.    Currently in Pain? Yes    Pain Score 5     Pain Location Neck    Pain  Orientation Upper    Pain Descriptors / Indicators Aching;Sore;Tightness    Pain Type Chronic pain    Pain Radiating Towards To upper shoulder and arm    Pain Onset More than a month ago    Pain Frequency Constant    Aggravating Factors  Prolonged sitting and flexed postures    Effect of Pain on Daily Activities Apprehension about return to golf and other physically demanding activities    Multiple Pain Sites No                [facility] PT Assessment - 12/07/21 0001       Assessment   Medical Diagnosis Neck Pain    Referring Provider (PT) Denita Lung MD    Onset Date/Surgical Date --   Chronic with exacerbation ~ 2 months ago     Precautions   Precautions Cervical    Precaution Comments Avoid slouched postures, careful attention to keep shoulders back and avoid flexion      Restrictions   Weight Bearing Restrictions No      Balance Screen   Has the patient fallen in the past 6 months No    Has the patient had a decrease in activity  level because of a fear of falling?  No    Is the patient reluctant to leave their home because of a fear of falling?  No      Prior Function   Level of Independence Independent    Vocation Retired    Leisure Agricultural consultant   Overall Cognitive Status Within Functional Limits for tasks assessed      Observation/Other Assessments   Focus on Therapeutic Outcomes (FOTO)  58 (Goal 70 in 10 visits)      ROM / Strength   AROM / PROM / Strength AROM;Strength      AROM   Overall AROM  Deficits    AROM Assessment Site Cervical    Cervical Extension 60    Cervical - Right Side Bend 20    Cervical - Left Side Bend 15    Cervical - Right Rotation 45    Cervical - Left Rotation 40      Strength   Overall Strength Deficits    Strength Assessment Site Cervical    Cervical Extension --   35.1 pounds (Goal 45+)   Cervical - Right Side Bend --   36.6 pounds   Cervical - Left Side Bend --   32.2 (Goal 26.4)                        Objective measurements completed on examination: See above findings.       [facility] Adult PT Treatment/Exercise - 12/07/21 0001       Posture/Postural Control   Posture/Postural Control Postural limitations    Postural Limitations Forward head;Rounded Shoulders;Decreased lumbar lordosis      Therapeutic Activites    Therapeutic Activities Other Therapeutic Activities    Other Therapeutic Activities Discussed imaging, spine anatomy, exam findings, importance of posture and reviewed day 1 HEP      Exercises   Exercises Neck      Neck Exercises: Standing   Other Standing Exercises Cervical rotation AROM (SBP 1st) 10X 5 seconds    Other Standing Exercises Shoulder blade pinches (SBP) 10X 5 seconds      Neck Exercises: Seated   Cervical Isometrics Extension;5 secs;10 reps                     PT Education - 12/07/21 1816     Education Details Reviewed imaging, looked at cervical muscles involved (online), reviewed spine anatomy, exam findings and started HEP.    Person(s) Educated Patient    Methods Explanation;Demonstration;Tactile cues;Verbal cues;Handout    Comprehension Verbalized understanding;Tactile cues required;Need further instruction;Returned demonstration;Verbal cues required              PT Short Term Goals - 12/07/21 1822       PT SHORT TERM GOAL #1   Title Improve cervical extension AROM to 65 degrees; lateral bending to 25 degrees and rotation to 50-60 degrees.    Baseline 60; 15-20; 40-45 degrees respectively    Time 4    Period Weeks    Status New    Target Date 01/04/22               PT Long Term Goals - 12/07/21 1823       PT LONG TERM GOAL #1   Title Improve FOTO to 70.    Baseline 58    Time 6    Period Weeks    Status New    Target Date 01/18/22  PT LONG TERM GOAL #2   Title Jason Ibarra will report neck pain consistently 0-2/10 on the Numeric Pain Rating Scale.    Baseline 5/10    Time 6     Period Weeks    Status New    Target Date 01/18/22      PT LONG TERM GOAL #3   Title Improve cervical extension strength to 45 pounds or more.    Baseline 35.1 pounds    Time 6    Period Weeks    Status New    Target Date 01/18/22      PT LONG TERM GOAL #4   Title Jason Ibarra will be independent with his DC HEP.    Baseline Started phase 1 today.    Time 6    Period Weeks    Status New    Target Date 01/18/22                    Plan - 12/07/21 1817     Clinical Impression Statement Jason Ibarra has had previous episodes of neck pain but thispractical body mechanics work should help Jason Ibarra meet long-term goals. one has been more severe.  He would like to return to golf and other more physical pursuits without exacerbating or being limited by neck pain.  He is quite stiff with a forward head posture and poor scapular and cervical extensors strength.  Postural education, strength, scapular retractors strengthening, cervical strengthening and    Personal Factors and Comorbidities Comorbidity 1    Comorbidities Previous RTC repair.    Examination-Activity Limitations Bend;Sit;Carry    Examination-Participation Restrictions Community Activity;Driving    Stability/Clinical Decision Making Stable/Uncomplicated    Clinical Decision Making Low    Rehab Potential Good    PT Frequency --   1-2X/week   PT Duration 6 weeks    PT Treatment/Interventions ADLs/Self Care Home Management;Electrical Stimulation;Cryotherapy;Moist Heat;Traction;Therapeutic activities;Neuromuscular re-education;Therapeutic exercise;Patient/family education;Manual techniques;Dry needling    PT Next Visit Plan Review HEP, progress mid-back, posterior RTC and scapular strength, practical body mechanics and postural education    PT Home Exercise Plan [email or web address]    Consulted and Agree with Plan of Care Patient             Patient will benefit from skilled therapeutic intervention in order to improve the following  deficits and impairments:  Decreased activity tolerance, Decreased endurance, Decreased range of motion, Decreased strength, Increased edema, Increased fascial restricitons, Increased muscle spasms, Impaired perceived functional ability, Impaired UE functional use, Improper body mechanics, Postural dysfunction, Pain  Visit Diagnosis: Abnormal posture  Localized edema  Muscle weakness (generalized)  Cervicalgia     Problem List Patient Active Problem List   Diagnosis Date Noted   Personal history of colonic polyps 06/29/2014   ED (erectile dysfunction) of non-organic origin 06/29/2014   BPH (benign prostatic hyperplasia) 03/24/2013   Hyperlipidemia with target LDL less than 130 03/24/2013   History of herpes genitalis 03/24/2013    Farley Ly, PT, MPT 12/07/2021, 6:26 PM  [facility] [address] [place], [place], [address] Phone: [phone]   Fax:  [phone]  Name: RYOT BURROUS MRN: [id number] Date of Birth: 16-Sep-1956

## 2021-12-07 NOTE — Patient Instructions (Signed)
Access Code: [email or web address] URL: [email or web address] Date: 12/07/2021 Prepared by: Vista Mink  Exercises Standing Isometric Cervical Extension with Manual Resistance - 5 x daily - 7 x weekly - 1 sets - 5 reps - 5 hold Seated Cervical Rotation AROM - 5 x daily - 7 x weekly - 1 sets - 10 reps - 5 seconds hold Standing Scapular Retraction - 5 x daily - 7 x weekly - 1 sets - 5 reps - 5 second hold

## 2021-12-13 ENCOUNTER — Other Ambulatory Visit: Payer: Self-pay

## 2021-12-13 ENCOUNTER — Ambulatory Visit: Payer: Medicare Other | Admitting: Rehabilitative and Restorative Service Providers"

## 2021-12-13 ENCOUNTER — Encounter: Payer: Self-pay | Admitting: Rehabilitative and Restorative Service Providers"

## 2021-12-13 DIAGNOSIS — M6281 Muscle weakness (generalized): Secondary | ICD-10-CM

## 2021-12-13 DIAGNOSIS — M542 Cervicalgia: Secondary | ICD-10-CM | POA: Diagnosis not present

## 2021-12-13 DIAGNOSIS — R293 Abnormal posture: Secondary | ICD-10-CM

## 2021-12-13 DIAGNOSIS — R600 Localized edema: Secondary | ICD-10-CM | POA: Diagnosis not present

## 2021-12-13 NOTE — Therapy (Signed)
[place], [place], [address] Phone: [phone]   Fax:  [phone]  Physical Therapy Treatment  Patient Details  Name: Jason Ibarra MRN: [id number] Date of Birth: [birth year] Referring Provider (PT): Denita Lung MD   Encounter Date: 12/13/2021   PT End of Session - 12/13/21 1149     Visit Number 2    Number of Visits 10    Date for PT Re-Evaluation 01/18/22    Progress Note Due on Visit 10    PT Start Time 3546    PT Stop Time 1228    PT Time Calculation (min) 39 min    Activity Tolerance Patient tolerated treatment well;No increased pain    Behavior During Therapy WFL for tasks assessed/performed             Past Medical History:  Diagnosis Date   Abdominal pain    Dyslipidemia    ED (erectile dysfunction)    Hemorrhoids    Herpes genitalis in men    Hiatal hernia    Hyperlipidemia    borderline per patient   Rectal pain     Past Surgical History:  Procedure Laterality Date   RETINAL DETACHMENT [place]   SKIN BIOPSY Left 10/13/2019   left clavicle pigmented seborrheic keratosis   SKIN BIOPSY Right 10/13/2019   right miid chest  pigmented seborrheic keratosis   TONSILLECTOMY  1962    There were no vitals filed for this visit.   Subjective Assessment - 12/13/21 1151     Subjective Pt. indicated feeling some better, mainly in back area.  Pt. indicated neck seemed to be similar.  Pt. stated having catch in middle cervical Rt side c flexion and rotation.    Pertinent History Previous RTC repair    Limitations Sitting;Reading;Lifting;House hold activities    How long can you sit comfortably? 30 minutes    Patient Stated Goals Return to golf and full function without being limited by neck pain.    Currently in Pain? Yes    Pain Score 3     Pain Location Neck    Pain Orientation Upper    Pain Descriptors / Indicators Aching;Sore    Pain  Type Chronic pain    Pain Onset More than a month ago    Aggravating Factors  prolonged stationary flexion position.    Pain Relieving Factors execise movements help                [facility] PT Assessment - 12/13/21 0001       Assessment   Medical Diagnosis Neck Pain    Referring Provider (PT) Denita Lung MD      AROM   Cervical - Right Rotation 54   after manual intervention today (arrived at 71 )                          [facility] Adult PT Treatment/Exercise - 12/13/21 0001       Exercises   Exercises Other Exercises    Other Exercises  Verbal and visual review of existing HEP.      Neck Exercises: Standing   Other Standing Exercises tband rows blue band c scapular retraction hold 5 sec x 15, gh ext blue band 20x bilateral      Neck Exercises: Supine   Other Supine Exercise upper cervical flexion hold 5 sec hold x 10  Manual Therapy   Manual therapy comments supine cervical downslope mobs g3 C3-C7 on Rt, cervical distraction                       PT Short Term Goals - 12/13/21 1229       PT SHORT TERM GOAL #1   Title Improve cervical extension AROM to 65 degrees; lateral bending to 25 degrees and rotation to 50-60 degrees.    Baseline 60; 15-20; 40-45 degrees respectively    Time 4    Period Weeks    Status On-going    Target Date 01/04/22               PT Long Term Goals - 12/07/21 1823       PT LONG TERM GOAL #1   Title Improve FOTO to 70.    Baseline 58    Time 6    Period Weeks    Status New    Target Date 01/18/22      PT LONG TERM GOAL #2   Title Jason Ibarra will report neck pain consistently 0-2/10 on the Numeric Pain Rating Scale.    Baseline 5/10    Time 6    Period Weeks    Status New    Target Date 01/18/22      PT LONG TERM GOAL #3   Title Improve cervical extension strength to 45 pounds or more.    Baseline 35.1 pounds    Time 6    Period Weeks    Status New    Target Date 01/18/22      PT  LONG TERM GOAL #4   Title Jason Ibarra will be independent with his DC HEP.    Baseline Started phase 1 today.    Time 6    Period Weeks    Status New    Target Date 01/18/22                   Plan - 12/13/21 1214     Clinical Impression Statement Upon assessment today, Rt cervical downslope and translatory glides throughout cervical spine were hypomobile and painful in area.  Use of manual intervention improved symptoms and gained Rt rotation mobility as noted. Continued mobility gains indicated for care.    Personal Factors and Comorbidities Comorbidity 1    Comorbidities Previous RTC repair.    Examination-Activity Limitations Bend;Sit;Carry    Examination-Participation Restrictions Community Activity;Driving    Stability/Clinical Decision Making Stable/Uncomplicated    Rehab Potential Good    PT Frequency --   1-2X/week   PT Duration 6 weeks    PT Treatment/Interventions ADLs/Self Care Home Management;Electrical Stimulation;Cryotherapy;Moist Heat;Traction;Therapeutic activities;Neuromuscular re-education;Therapeutic exercise;Patient/family education;Manual techniques;Dry needling    PT Next Visit Plan Review HEP, progress mid-back, posterior RTC and scapular strength, practical body mechanics and postural education    PT Home Exercise Plan [email or web address]    Consulted and Agree with Plan of Care Patient             Patient will benefit from skilled therapeutic intervention in order to improve the following deficits and impairments:  Decreased activity tolerance, Decreased endurance, Decreased range of motion, Decreased strength, Increased edema, Increased fascial restricitons, Increased muscle spasms, Impaired perceived functional ability, Impaired UE functional use, Improper body mechanics, Postural dysfunction, Pain  Visit Diagnosis: Abnormal posture  Localized edema  Muscle weakness (generalized)  Cervicalgia     Problem List Patient Active Problem List   Diagnosis  Date Noted  Personal history of colonic polyps 06/29/2014   ED (erectile dysfunction) of non-organic origin 06/29/2014   BPH (benign prostatic hyperplasia) 03/24/2013   Hyperlipidemia with target LDL less than 130 03/24/2013   History of herpes genitalis 03/24/2013    Scot Jun, PT, DPT, OCS, ATC 12/13/21  12:58 PM    [facility] [address] [place], [place], [address] Phone: [phone]   Fax:  [phone]  Name: Jason Ibarra MRN: [id number] Date of Birth: 05/28/1956

## 2021-12-29 ENCOUNTER — Encounter: Payer: Medicare Other | Admitting: Rehabilitative and Restorative Service Providers"

## 2022-01-05 ENCOUNTER — Other Ambulatory Visit: Payer: Self-pay

## 2022-01-05 ENCOUNTER — Ambulatory Visit: Payer: Medicare Other | Admitting: Rehabilitative and Restorative Service Providers"

## 2022-01-05 ENCOUNTER — Encounter: Payer: Self-pay | Admitting: Rehabilitative and Restorative Service Providers"

## 2022-01-05 DIAGNOSIS — R600 Localized edema: Secondary | ICD-10-CM

## 2022-01-05 DIAGNOSIS — M6281 Muscle weakness (generalized): Secondary | ICD-10-CM | POA: Diagnosis not present

## 2022-01-05 DIAGNOSIS — R293 Abnormal posture: Secondary | ICD-10-CM

## 2022-01-05 DIAGNOSIS — M542 Cervicalgia: Secondary | ICD-10-CM

## 2022-01-05 NOTE — Therapy (Signed)
[place], [place], [address] Phone: [phone]   Fax:  [phone]  Physical Therapy Treatment/Reassessment  Patient Details  Name: Jason Ibarra MRN: [id number] Date of Birth: March 28, 1956 Referring Provider (PT): Denita Lung MD   Encounter Date: 01/05/2022   PT End of Session - 01/05/22 1308     Visit Number 3    Number of Visits 10    Date for PT Re-Evaluation 01/18/22    Progress Note Due on Visit 10    PT Start Time 0930    PT Stop Time 9892    PT Time Calculation (min) 45 min    Activity Tolerance Patient tolerated treatment well;No increased pain    Behavior During Therapy WFL for tasks assessed/performed             Past Medical History:  Diagnosis Date   Abdominal pain    Dyslipidemia    ED (erectile dysfunction)    Hemorrhoids    Herpes genitalis in men    Hiatal hernia    Hyperlipidemia    borderline per patient   Rectal pain     Past Surgical History:  Procedure Laterality Date   RETINAL DETACHMENT [place]   SKIN BIOPSY Left 10/13/2019   left clavicle pigmented seborrheic keratosis   SKIN BIOPSY Right 10/13/2019   right miid chest  pigmented seborrheic keratosis   TONSILLECTOMY  1962    There were no vitals filed for this visit.   Subjective Assessment - 01/05/22 1000     Subjective Charlie is using ibuprofen before golf.  Sleep is a bit better.  Less dizziness is noted.  Scapular pain has decreased since starting PT.    Pertinent History Previous RTC repair    Limitations Sitting;Reading;Lifting;House hold activities    How long can you sit comfortably? 30 minutes    Patient Stated Goals Return to golf and full function without being limited by neck pain.    Currently in Pain? No/denies    Pain Score 0-No pain    Pain Location Scapula    Pain Orientation Right    Pain Descriptors / Indicators Aching;Sore    Pain Type  Chronic pain    Pain Radiating Towards R anterior, upper chest and deltoid    Pain Onset More than a month ago    Pain Frequency Intermittent    Aggravating Factors  Movement, slouched postures    Pain Relieving Factors Exercise    Effect of Pain on Daily Activities Apprehensive about return to golf and other physically demanding activities    Multiple Pain Sites No                [facility] PT Assessment - 01/05/22 0001       Cognition   Overall Cognitive Status Within Functional Limits for tasks assessed      Observation/Other Assessments   Focus on Therapeutic Outcomes (FOTO)  57 (Goal 70 in 10 visits)      ROM / Strength   AROM / PROM / Strength AROM;Strength      AROM   Overall AROM  Deficits    AROM Assessment Site Cervical;Shoulder    Right/Left Shoulder Left;Right    Right Shoulder Flexion 170 Degrees    Right Shoulder Internal Rotation 55 Degrees    Right Shoulder External Rotation 95 Degrees    Right Shoulder Horizontal  ADduction 30 Degrees    Left Shoulder  Flexion 170 Degrees    Left Shoulder Internal Rotation 70 Degrees    Left Shoulder External Rotation 90 Degrees    Left Shoulder Horizontal ADduction 45 Degrees    Cervical Extension 65    Cervical - Right Side Bend 20    Cervical - Left Side Bend 20    Cervical - Right Rotation 65    Cervical - Left Rotation 60      Strength   Overall Strength Deficits    Strength Assessment Site Cervical;Shoulder    Right/Left Shoulder Left;Right    Right Shoulder Internal Rotation --   43.5 pounds   Right Shoulder External Rotation --   26.7 pounds   Left Shoulder Internal Rotation --   40.2 pounds   Left Shoulder External Rotation --   23.8 pounds   Cervical Extension --   50.2 pounds   Cervical - Right Side Bend --   37.9 pounds   Cervical - Left Side Bend --   40.7 pounds                          [facility] Adult PT Treatment/Exercise - 01/05/22 0001       Posture/Postural Control    Posture/Postural Control Postural limitations    Postural Limitations Forward head;Rounded Shoulders;Decreased lumbar lordosis      Therapeutic Activites    Therapeutic Activities Other Therapeutic Activities    Other Therapeutic Activities Discussed imaging, spine anatomy, exam findings, importance of posture and reviewed day 1 HEP      Exercises   Exercises Neck      Neck Exercises: Theraband   Rows 20 reps;Blue    Shoulder External Rotation 10 reps;Green      Neck Exercises: Standing   Other Standing Exercises Cervical rotation AROM (SBP 1st) 10X 5 seconds    Other Standing Exercises Shoulder blade pinches (SBP) 10X 5 seconds      Neck Exercises: Seated   Cervical Isometrics Extension;5 secs;10 reps    Other Seated Exercise Thumb up the back stretch 10X 10 seconds and posterior capsule stretch 10X 10 seconds                     PT Education - 01/05/22 1305     Education Details Reviewed HEP, progressed scapular and shoulder strength    Person(s) Educated Patient    Methods Explanation;Demonstration;Tactile cues;Verbal cues;Handout    Comprehension Verbalized understanding;Tactile cues required;Need further instruction;Returned demonstration;Verbal cues required              PT Short Term Goals - 01/05/22 1306       PT SHORT TERM GOAL #1   Title Improve cervical extension AROM to 65 degrees; lateral bending to 25 degrees and rotation to 50-60 degrees.    Baseline 65; 20/20; 60/54 degrees respectively (was 60; 15-20; 40-45 degrees respectively)    Time 4    Period Weeks    Status Partially Met    Target Date 01/04/22               PT Long Term Goals - 01/05/22 1306       PT LONG TERM GOAL #1   Title Improve FOTO to 70.    Baseline 57 (was 58)    Time 4    Period Weeks    Status On-going    Target Date 02/02/22      PT LONG TERM GOAL #2   Title [name] will report  neck pain consistently 0-2/10 on the Numeric Pain Rating Scale.    Baseline  4/10 (was 5/10)    Time 4    Period Weeks    Status New    Target Date 02/02/22      PT LONG TERM GOAL #3   Title Improve cervical extension strength to 45 pounds or more.    Baseline Met (was 35.1 pounds)    Time 6    Period Weeks    Status Achieved    Target Date 01/18/22      PT LONG TERM GOAL #4   Title [name] will be independent with his DC HEP.    Baseline Started phase 1 today.    Time 4    Period Weeks    Status On-going    Target Date 02/02/22                   Plan - 01/05/22 1309     Clinical Impression Statement [name] is making objective progress towards LTGs established at evaluation.  Some poor body mechanics and inconsistent attendance in PT (only 3 visits thus far) have limited progress.  Despite this, some AROM and strength goals have been met.  Despite a lack of progress with [facility], [name] notes sleeping better, he has less dizziness and less scapular pain.  More consistent attendance and better postural awareness should allow Charlie to meet LTGs in a month or less.    Personal Factors and Comorbidities Comorbidity 1    Comorbidities Previous RTC repair.    Examination-Activity Limitations Bend;Sit;Carry    Examination-Participation Restrictions Community Activity;Driving    Stability/Clinical Decision Making Stable/Uncomplicated    Rehab Potential Good    PT Frequency --   1-2X/week   PT Duration 4 weeks    PT Treatment/Interventions ADLs/Self Care Home Management;Electrical Stimulation;Cryotherapy;Moist Heat;Traction;Therapeutic activities;Neuromuscular re-education;Therapeutic exercise;Patient/family education;Manual techniques;Dry needling    PT Next Visit Plan Progress mid-back, posterior RTC and scapular strength, practical body mechanics and postural education    PT Home Exercise Plan [email or web address]    Consulted and Agree with Plan of Care Patient             Patient will benefit from skilled therapeutic intervention in order to improve  the following deficits and impairments:  Decreased activity tolerance, Decreased endurance, Decreased range of motion, Decreased strength, Increased edema, Increased fascial restricitons, Increased muscle spasms, Impaired perceived functional ability, Impaired UE functional use, Improper body mechanics, Postural dysfunction, Pain  Visit Diagnosis: Abnormal posture  Localized edema  Muscle weakness (generalized)  Cervicalgia     Problem List Patient Active Problem List   Diagnosis Date Noted   Personal history of colonic polyps 06/29/2014   ED (erectile dysfunction) of non-organic origin 06/29/2014   BPH (benign prostatic hyperplasia) 03/24/2013   Hyperlipidemia with target LDL less than 130 03/24/2013   History of herpes genitalis 03/24/2013    Farley Ly, PT, MPT 01/05/2022, 1:14 PM  [facility] [address] [place], [place], [address] Phone: [phone]   Fax:  [phone]  Name: ZIAD MAYE MRN: [id number] Date of Birth: April 19, 1956

## 2022-01-05 NOTE — Patient Instructions (Signed)
Access Code: [email or web address] URL: [email or web address] Date: 01/05/2022 Prepared by: Vista Mink  Exercises Standing Isometric Cervical Extension with Manual Resistance - 5 x daily - 7 x weekly - 1 sets - 5 reps - 5 hold Seated Cervical Rotation AROM - 5 x daily - 7 x weekly - 1 sets - 10 reps - 5 seconds hold Standing Scapular Retraction - 5 x daily - 7 x weekly - 1 sets - 5 reps - 5 second hold Standing Shoulder Internal Rotation Stretch with Hands Behind Back - 2-3 x daily - 7 x weekly - 1 sets - 10 reps - 10 seconds hold Standing Shoulder Posterior Capsule Stretch - 2-3 x daily - 7 x weekly - 1 sets - 10 reps - 10 hold Scapular Retraction with Resistance - 1 x daily - 7 x weekly - 1 sets - 20 reps - 3 seconds hold Shoulder External Rotation with Anchored Resistance with Towel Under Elbow - 1 x daily - 3 x weekly - 2-3 sets - 10 reps - 3 seconds hold

## 2022-01-12 ENCOUNTER — Other Ambulatory Visit: Payer: Self-pay

## 2022-01-12 ENCOUNTER — Ambulatory Visit: Payer: Medicare Other | Admitting: Rehabilitative and Restorative Service Providers"

## 2022-01-12 ENCOUNTER — Encounter: Payer: Self-pay | Admitting: Rehabilitative and Restorative Service Providers"

## 2022-01-12 DIAGNOSIS — R293 Abnormal posture: Secondary | ICD-10-CM | POA: Diagnosis not present

## 2022-01-12 DIAGNOSIS — M542 Cervicalgia: Secondary | ICD-10-CM

## 2022-01-12 DIAGNOSIS — R600 Localized edema: Secondary | ICD-10-CM

## 2022-01-12 DIAGNOSIS — M6281 Muscle weakness (generalized): Secondary | ICD-10-CM | POA: Diagnosis not present

## 2022-01-12 NOTE — Patient Instructions (Signed)
Access Code: [email or web address] URL: [email or web address] Date: 01/12/2022 Prepared by: Vista Mink  Exercises Standing Isometric Cervical Extension with Manual Resistance - 5 x daily - 7 x weekly - 1 sets - 5 reps - 5 hold Seated Cervical Rotation AROM - 1-2 x daily - 7 x weekly - 1 sets - 10 reps - 5 seconds hold Standing Scapular Retraction - 5 x daily - 7 x weekly - 1 sets - 5 reps - 5 second hold Standing Shoulder Internal Rotation Stretch with Hands Behind Back - 1-2 x daily - 7 x weekly - 1 sets - 10 reps - 10 seconds hold Standing Shoulder Posterior Capsule Stretch - 1-2 x daily - 7 x weekly - 1 sets - 10 reps - 10 hold Scapular Retraction with Resistance - 1 x daily - 3 x weekly - 1 sets - 20 reps - 3 seconds hold Shoulder External Rotation with Anchored Resistance with Towel Under Elbow - 1 x daily - 3 x weekly - 2-3 sets - 10 reps - 3 seconds hold Prone Shoulder Horizontal Abduction with Thumbs Up - 1 x daily - 7 x weekly - 3 sets - 5 reps - 3 seconds hold Prone Alternating Arm and Leg Lifts - 1 x daily - 7 x weekly - 1-2 sets - 10 reps - 3-10 seconds hold Shoulder Extension with Resistance Hands Down - 1 x daily - 3 x weekly - 1 sets - 10 reps - 3 seconds hold

## 2022-01-12 NOTE — Therapy (Signed)
[facility] Physical Therapy [address] [place], [place], [address] Phone: [phone]   Fax:  [phone]  Physical Therapy Treatment  Patient Details  Name: Jason Ibarra MRN: [id number] Date of Birth: 03/11/1956 Referring Provider (PT): Denita Lung MD   Encounter Date: 01/12/2022   PT End of Session - 01/12/22 1027     Visit Number 4    Number of Visits 10    Date for PT Re-Evaluation 01/18/22    Progress Note Due on Visit 10    PT Start Time 0932    PT Stop Time 1015    PT Time Calculation (min) 43 min    Activity Tolerance Patient tolerated treatment well    Behavior During Therapy [facility] for tasks assessed/performed             Past Medical History:  Diagnosis Date   Abdominal pain    Dyslipidemia    ED (erectile dysfunction)    Hemorrhoids    Herpes genitalis in men    Hiatal hernia    Hyperlipidemia    borderline per patient   Rectal pain     Past Surgical History:  Procedure Laterality Date   RETINAL DETACHMENT [place]   SKIN BIOPSY Left 10/13/2019   left clavicle pigmented seborrheic keratosis   SKIN BIOPSY Right 10/13/2019   right miid chest  pigmented seborrheic keratosis   TONSILLECTOMY  1962    There were no vitals filed for this visit.   Subjective Assessment - 01/12/22 1024     Subjective Jason Ibarra notes he is having more upper trapezius pain but less scapular pain.    Pertinent History Previous RTC repair    Limitations Sitting;Reading;Lifting;House hold activities    How long can you sit comfortably? 30 minutes    Patient Stated Goals Return to golf and full function without being limited by neck pain.    Currently in Pain? Yes    Pain Score 4     Pain Location Neck    Pain Orientation Right    Pain Descriptors / Indicators Aching;Tightness;Sore    Pain Type Chronic pain    Pain Radiating Towards Symptoms appear to be centralizing from the scapula to the  upper trapezius and neck    Pain Onset More than a month ago    Pain Frequency Constant    Aggravating Factors  Watching TV in a flexed posture    Pain Relieving Factors Exercise and postural correction    Effect of Pain on Daily Activities Pain limits full participation in golf and other physical activities and hobbies (golf)    Multiple Pain Sites No                               [facility] Adult PT Treatment/Exercise - 01/12/22 0001       Posture/Postural Control   Posture/Postural Control Postural limitations    Postural Limitations Forward head;Rounded Shoulders;Decreased lumbar lordosis      Therapeutic Activites    Therapeutic Activities Other Therapeutic Activities    Other Therapeutic Activities Reviewed RA findings from last visit, reviewed postural education, HEP (with progressions) and importance of home walking program      Exercises   Exercises Neck      Neck Exercises: Theraband   Shoulder Extension 10 reps;Red;Limitations    Shoulder Extension Limitations Palms up, elbows straight, pinch scapulae  Rows 20 reps;Blue    Shoulder External Rotation 10 reps;Green      Neck Exercises: Standing   Other Standing Exercises Cervical rotation AROM (SBP 1st) 10X 5 seconds    Other Standing Exercises Shoulder blade pinches (SBP) 10X 5 seconds & SBP with shrug 10X 5 seconds      Neck Exercises: Seated   Cervical Isometrics Extension;5 secs;10 reps    Other Seated Exercise Thumb up the back stretch 10X 10 seconds and posterior capsule stretch 10X 10 seconds      Neck Exercises: Prone   Other Prone Exercise Prone 90 degrees with full shoulder ER (lift legs too with knees staright and toes pulled back) 3 sets of 5 for 3 seconds    Other Prone Exercise Prone opposite arm and leg extension (palms in and toes pulled back) 10X 3 seconds                     PT Education - 01/12/22 1026     Education Details Reviewed importance of home walking  program, consistent HEP compliance and postural awareness.    Person(s) Educated Patient    Methods Explanation;Handout;Demonstration;Verbal cues    Comprehension Verbalized understanding;Need further instruction;Returned demonstration;Verbal cues required              PT Short Term Goals - 01/05/22 1306       PT SHORT TERM GOAL #1   Title Improve cervical extension AROM to 65 degrees; lateral bending to 25 degrees and rotation to 50-60 degrees.    Baseline 65; 20/20; 60/54 degrees respectively (was 60; 15-20; 40-45 degrees respectively)    Time 4    Period Weeks    Status Partially Met    Target Date 01/04/22               PT Long Term Goals - 01/05/22 1306       PT LONG TERM GOAL #1   Title Improve FOTO to 70.    Baseline 57 (was 58)    Time 4    Period Weeks    Status On-going    Target Date 02/02/22      PT LONG TERM GOAL #2   Title Jason Ibarra will report neck pain consistently 0-2/10 on the Numeric Pain Rating Scale.    Baseline 4/10 (was 5/10)    Time 4    Period Weeks    Status New    Target Date 02/02/22      PT LONG TERM GOAL #3   Title Improve cervical extension strength to 45 pounds or more.    Baseline Met (was 35.1 pounds)    Time 6    Period Weeks    Status Achieved    Target Date 01/18/22      PT LONG TERM GOAL #4   Title Jason Ibarra will be independent with his DC HEP.    Baseline Started phase 1 today.    Time 4    Period Weeks    Status On-going    Target Date 02/02/22                   Plan - 01/12/22 1027     Clinical Impression Statement Jason Ibarra is making overall objective progress towards LTGs established at evaluation.  Postural awreness and consistency with foundational day 1 HEP exercises will need to improve for more rapid progress.  Progressions were made today with postural/cervical strength and I expect Jason Ibarra to meet LTGs as outlined at  evaluation.    Personal Factors and Comorbidities Comorbidity 1     Comorbidities Previous RTC repair.    Examination-Activity Limitations Bend;Sit;Carry    Examination-Participation Restrictions Community Activity;Driving    Stability/Clinical Decision Making Stable/Uncomplicated    Rehab Potential Good    PT Frequency --   1-2X/week   PT Duration 4 weeks    PT Treatment/Interventions ADLs/Self Care Home Management;Electrical Stimulation;Cryotherapy;Moist Heat;Traction;Therapeutic activities;Neuromuscular re-education;Therapeutic exercise;Patient/family education;Manual techniques;Dry needling    PT Next Visit Plan Continue to progress cervical, mid-back, posterior RTC and scapular strength, practical body mechanics and postural education    PT Home Exercise Plan [email or web address]    Consulted and Agree with Plan of Care Patient             Patient will benefit from skilled therapeutic intervention in order to improve the following deficits and impairments:  Decreased activity tolerance, Decreased endurance, Decreased range of motion, Decreased strength, Increased edema, Increased fascial restricitons, Increased muscle spasms, Impaired perceived functional ability, Impaired UE functional use, Improper body mechanics, Postural dysfunction, Pain  Visit Diagnosis: Abnormal posture  Localized edema  Muscle weakness (generalized)  Cervicalgia     Problem List Patient Active Problem List   Diagnosis Date Noted   Personal history of colonic polyps 06/29/2014   ED (erectile dysfunction) of non-organic origin 06/29/2014   BPH (benign prostatic hyperplasia) 03/24/2013   Hyperlipidemia with target LDL less than 130 03/24/2013   History of herpes genitalis 03/24/2013    Farley Ly, PT, MPT 01/12/2022, 10:34 AM  [facility] Physical Therapy [address] [place], [place], [address] Phone: [phone]   Fax:  [phone]  Name: Jason Ibarra MRN: [id number] Date of Birth: [birth year]

## 2022-01-19 ENCOUNTER — Encounter: Payer: Self-pay | Admitting: Rehabilitative and Restorative Service Providers"

## 2022-01-19 ENCOUNTER — Other Ambulatory Visit: Payer: Self-pay

## 2022-01-19 ENCOUNTER — Ambulatory Visit: Payer: Medicare Other | Admitting: Rehabilitative and Restorative Service Providers"

## 2022-01-19 DIAGNOSIS — R600 Localized edema: Secondary | ICD-10-CM

## 2022-01-19 DIAGNOSIS — R293 Abnormal posture: Secondary | ICD-10-CM

## 2022-01-19 DIAGNOSIS — M6281 Muscle weakness (generalized): Secondary | ICD-10-CM

## 2022-01-19 DIAGNOSIS — M542 Cervicalgia: Secondary | ICD-10-CM | POA: Diagnosis not present

## 2022-01-19 NOTE — Patient Instructions (Signed)
Access Code: [email or web address] URL: [email or web address] Date: 01/19/2022 Prepared by: Vista Mink  Exercises Standing Isometric Cervical Extension with Manual Resistance - 5 x daily - 7 x weekly - 1 sets - 5 reps - 5 hold Seated Cervical Rotation AROM - 1-2 x daily - 7 x weekly - 1 sets - 10 reps - 5 seconds hold Standing Scapular Retraction - 5 x daily - 7 x weekly - 1 sets - 5 reps - 5 second hold Standing Shoulder Internal Rotation Stretch with Hands Behind Back - 1-2 x daily - 7 x weekly - 1 sets - 10 reps - 10 seconds hold Standing Shoulder Posterior Capsule Stretch - 1-2 x daily - 7 x weekly - 1 sets - 10 reps - 10 hold Scapular Retraction with Resistance - 1 x daily - 3 x weekly - 1 sets - 20 reps - 3 seconds hold Shoulder External Rotation with Anchored Resistance with Towel Under Elbow - 1 x daily - 3 x weekly - 2-3 sets - 10 reps - 3 seconds hold Prone Shoulder Horizontal Abduction with Thumbs Up - 1 x daily - 7 x weekly - 3 sets - 5 reps - 3 seconds hold Prone Alternating Arm and Leg Lifts - 1 x daily - 7 x weekly - 1-2 sets - 10 reps - 3-10 seconds hold Shoulder Extension with Resistance Hands Down - 1 x daily - 3 x weekly - 1 sets - 10 reps - 3 seconds hold

## 2022-01-19 NOTE — Therapy (Addendum)
[facility] Physical Therapy [address] [place], [place], [address] Phone: [phone]   Fax:  [phone]  Physical Therapy Treatment  Patient Details  Name: Jason Ibarra MRN: [id number] Date of Birth: [birth year] Referring Provider (PT): Denita Lung MD   Encounter Date: 01/19/2022   PT End of Session - 01/19/22 1740     Visit Number 5    Number of Visits 10    Date for PT Re-Evaluation 01/18/22    Progress Note Due on Visit 10    PT Start Time 0930    PT Stop Time 1017    PT Time Calculation (min) 47 min    Activity Tolerance Patient tolerated treatment well    Behavior During Therapy [facility] for tasks assessed/performed             Past Medical History:  Diagnosis Date   Abdominal pain    Dyslipidemia    ED (erectile dysfunction)    Hemorrhoids    Herpes genitalis in men    Hiatal hernia    Hyperlipidemia    borderline per patient   Rectal pain     Past Surgical History:  Procedure Laterality Date   RETINAL DETACHMENT [place]   SKIN BIOPSY Left 10/13/2019   left clavicle pigmented seborrheic keratosis   SKIN BIOPSY Right 10/13/2019   right miid chest  pigmented seborrheic keratosis   TONSILLECTOMY  1962    There were no vitals filed for this visit.   Subjective Assessment - 01/19/22 0935     Subjective Jason Ibarra notes a return of his scapular pain.  He still has upper trapezius and neck pain.    Pertinent History Previous RTC repair    Limitations Sitting;Reading;Lifting;House hold activities    How long can you sit comfortably? Doesn't seem to notice a difference    Patient Stated Goals Return to golf and full function without being limited by neck pain.    Currently in Pain? Yes    Pain Score 6     Pain Location Scapula    Pain Orientation Right    Pain Descriptors / Indicators Spasm    Pain Type Chronic pain    Pain Radiating Towards Scapula to upper trap to neck    Pain  Onset More than a month ago    Pain Frequency Constant    Aggravating Factors  Watching TV in a flexed posture    Pain Relieving Factors Exercise and postural correction    Effect of Pain on Daily Activities Limits participation in golf and other physical activities and hobbies (golf)    Multiple Pain Sites No                [facility] PT Assessment - 01/19/22 0001       Observation/Other Assessments   Focus on Therapeutic Outcomes (FOTO)  61 (was 57, Goal 70)                           [facility] Adult PT Treatment/Exercise - 01/19/22 0001       Posture/Postural Control   Posture/Postural Control Postural limitations    Postural Limitations Forward head;Rounded Shoulders;Decreased lumbar lordosis      Exercises   Exercises Neck      Neck Exercises: Theraband   Shoulder Extension 10 reps;Red;Limitations    Shoulder Extension Limitations Palms up, elbows straight, pinch scapulae    Rows 20 reps;Blue  Shoulder External Rotation 10 reps;Green      Neck Exercises: Standing   Other Standing Exercises Shoulder blade pinches (SBP) 10X 5 seconds & SBP with shrug 10X 5 seconds      Neck Exercises: Seated   Cervical Isometrics Extension;5 secs;10 reps      Neck Exercises: Prone   Other Prone Exercise Prone 90 degrees with full shoulder ER (lift legs too with knees staright and toes pulled back) 3 sets of 5 for 3 seconds    Other Prone Exercise Prone opposite arm and leg extension (palms in and toes pulled back) 10X 3 seconds      Modalities   Modalities Traction      Traction   Type of Traction Cervical    Min (lbs) 0    Max (lbs) 25    Hold Time Static 8 minutes    Rest Time Static    Time 10 minutes (1 minute rise/fall)      Manual Therapy   Manual therapy comments STM with active compression and skilled palpation with DN              Trigger Point Dry Needling - 01/19/22 0001     Consent Given? Yes    Education Handout Provided Yes    Muscles  Treated Upper Quadrant Infraspinatus;Teres minor    Dry Needling Comments good overall tolerance to intervention, twitch response elicited, DN performed by Jason Ibarra PT,DPT                   PT Education - 01/19/22 1741     Education Details Reviewed HEP with emphasis on postural strength and awareness.  Dry needling education and education regarding cervical traction.    Person(s) Educated Patient    Methods Explanation;Demonstration;Tactile cues;Verbal cues    Comprehension Verbalized understanding;Tactile cues required;Need further instruction;Returned demonstration;Verbal cues required              PT Short Term Goals - 01/05/22 1306       PT SHORT TERM GOAL #1   Title Improve cervical extension AROM to 65 degrees; lateral bending to 25 degrees and rotation to 50-60 degrees.    Baseline 65; 20/20; 60/54 degrees respectively (was 60; 15-20; 40-45 degrees respectively)    Time 4    Period Weeks    Status Partially Met    Target Date 01/04/22               PT Long Term Goals - 01/05/22 1306       PT LONG TERM GOAL #1   Title Improve FOTO to 70.    Baseline 57 (was 58)    Time 4    Period Weeks    Status On-going    Target Date 02/02/22      PT LONG TERM GOAL #2   Title Jason Ibarra will report neck pain consistently 0-2/10 on the Numeric Pain Rating Scale.    Baseline 4/10 (was 5/10)    Time 4    Period Weeks    Status New    Target Date 02/02/22      PT LONG TERM GOAL #3   Title Improve cervical extension strength to 45 pounds or more.    Baseline Met (was 35.1 pounds)    Time 6    Period Weeks    Status Achieved    Target Date 01/18/22      PT LONG TERM GOAL #4   Title Jason Ibarra will be independent with his DC  HEP.    Baseline Started phase 1 today.    Time 4    Period Weeks    Status On-going    Target Date 02/02/22                   Plan - 01/19/22 1742     Clinical Impression Statement Jason Ibarra has made objective strength  progress towards long-term goals.  Scapular pain has returned over the past few days.  I suspect postural awareness and body mechanics are contributors to his flare-up and we added cervical traction and dry needling to assess how they might be of some assistance to his long-term focused HEP.  Continue POC with emphasis on posture and postural strengthening.    Personal Factors and Comorbidities Comorbidity 1    Comorbidities Previous RTC repair.    Examination-Activity Limitations Bend;Sit;Carry    Examination-Participation Restrictions Community Activity;Driving    Stability/Clinical Decision Making Stable/Uncomplicated    Rehab Potential Good    PT Frequency 2x / week   1-2X/week   PT Duration 4 weeks    PT Treatment/Interventions ADLs/Self Care Home Management;Electrical Stimulation;Cryotherapy;Moist Heat;Traction;Therapeutic activities;Neuromuscular re-education;Therapeutic exercise;Patient/family education;Manual techniques;Dry needling    PT Next Visit Plan Continue to progress cervical, mid-back, posterior RTC and scapular strength, practical body mechanics and postural education.  Cervical traction and dry needling.    PT Home Exercise Plan [email or web address]    Consulted and Agree with Plan of Care Patient             Patient will benefit from skilled therapeutic intervention in order to improve the following deficits and impairments:  Decreased activity tolerance, Decreased endurance, Decreased range of motion, Decreased strength, Increased edema, Increased fascial restricitons, Increased muscle spasms, Impaired perceived functional ability, Impaired UE functional use, Improper body mechanics, Postural dysfunction, Pain  Visit Diagnosis: Abnormal posture  Muscle weakness (generalized)  Cervicalgia  Localized edema     Problem List Patient Active Problem List   Diagnosis Date Noted   Personal history of colonic polyps 06/29/2014   ED (erectile dysfunction) of non-organic origin  06/29/2014   BPH (benign prostatic hyperplasia) 03/24/2013   Hyperlipidemia with target LDL less than 130 03/24/2013   History of herpes genitalis 03/24/2013    Farley Ly, PT, MPT 01/19/2022, 5:45 PM  Jason Ibarra, PT, DPT 01/20/22 7:58 AM   [facility] Physical Therapy [address] [place], [place], [address] Phone: [phone]   Fax:  [phone]  Name: JARQUIS WALKER MRN: [id number] Date of Birth: Aug 27, 1956

## 2022-01-26 ENCOUNTER — Other Ambulatory Visit: Payer: Self-pay

## 2022-01-26 ENCOUNTER — Telehealth: Payer: Self-pay | Admitting: Physical Medicine and Rehabilitation

## 2022-01-26 ENCOUNTER — Encounter: Payer: Self-pay | Admitting: Rehabilitative and Restorative Service Providers"

## 2022-01-26 ENCOUNTER — Ambulatory Visit: Payer: Medicare Other | Admitting: Rehabilitative and Restorative Service Providers"

## 2022-01-26 DIAGNOSIS — R600 Localized edema: Secondary | ICD-10-CM

## 2022-01-26 DIAGNOSIS — R293 Abnormal posture: Secondary | ICD-10-CM | POA: Diagnosis not present

## 2022-01-26 DIAGNOSIS — M6281 Muscle weakness (generalized): Secondary | ICD-10-CM

## 2022-01-26 DIAGNOSIS — M542 Cervicalgia: Secondary | ICD-10-CM | POA: Diagnosis not present

## 2022-01-26 NOTE — Therapy (Signed)
[facility] [address] [place], [place], [address] Phone: [phone]   Fax:  [phone]  Physical Therapy Treatment  Patient Details  Name: Jason Ibarra MRN: [id number] Date of Birth: 07/25/1956 Referring Provider (PT): Denita Lung MD  Progress Note Reporting Period 12/07/2021 to 01/26/2022  See note below for Objective Data and Assessment of Progress/Goals.     Encounter Date: 01/26/2022   PT End of Session - 01/26/22 1728     Visit Number 6    Number of Visits 10    Date for PT Re-Evaluation 01/18/22    Progress Note Due on Visit 10    PT Start Time 0930    PT Stop Time 1017    PT Time Calculation (min) 47 min    Activity Tolerance Patient tolerated treatment well    Behavior During Therapy WFL for tasks assessed/performed             Past Medical History:  Diagnosis Date   Abdominal pain    Dyslipidemia    ED (erectile dysfunction)    Hemorrhoids    Herpes genitalis in men    Hiatal hernia    Hyperlipidemia    borderline per patient   Rectal pain     Past Surgical History:  Procedure Laterality Date   RETINAL DETACHMENT [place]   SKIN BIOPSY Left 10/13/2019   left clavicle pigmented seborrheic keratosis   SKIN BIOPSY Right 10/13/2019   right miid chest  pigmented seborrheic keratosis   TONSILLECTOMY  1962    There were no vitals filed for this visit.   Subjective Assessment - 01/26/22 1732     Subjective Jason Ibarra notes some progress with his scapular pain.  Upper trapezius and neck pain can still be severe and affects function.    Pertinent History Previous RTC repair    Limitations Sitting;Reading;Lifting;House hold activities    How long can you sit comfortably? Doesn't seem to notice a difference    Patient Stated Goals Return to golf and full function without being limited by neck pain.    Currently in Pain? Yes    Pain Score 6     Pain Location  Neck    Pain Orientation Right    Pain Descriptors / Indicators Tender;Constant;Tightness;Jabbing;Sharp    Pain Type Chronic pain    Pain Radiating Towards Some scapular soreness (although improved), mostly upper trapezius and neck    Pain Onset More than a month ago    Pain Frequency Constant    Aggravating Factors  Flexed postures    Pain Relieving Factors Exercise and postural awareness    Effect of Pain on Daily Activities Limits participation in more physically demanding activities (golf)    Multiple Pain Sites No                [facility] PT Assessment - 01/26/22 0001       ROM / Strength   AROM / PROM / Strength AROM;Strength      AROM   Overall AROM  Deficits    AROM Assessment Site Cervical    Cervical Extension 60    Cervical - Right Side Bend 20    Cervical - Left Side Bend 15    Cervical - Right Rotation 65    Cervical - Left Rotation 65      Strength   Overall Strength Deficits    Strength Assessment Site Cervical    Cervical  Extension --   43.6 pounds (Goal > 20% BW, 44.8)   Cervical - Right Side Bend --   45.9 pounds (Goal > 27)   Cervical - Left Side Bend --   35.6 pounds (Goal > 27)                          [facility] Adult PT Treatment/Exercise - 01/26/22 0001       Posture/Postural Control   Posture/Postural Control Postural limitations    Postural Limitations Forward head;Rounded Shoulders;Decreased lumbar lordosis      Therapeutic Activites    Therapeutic Activities Other Therapeutic Activities    Other Therapeutic Activities Reviewed assessment findings, updated HEP      Exercises   Exercises Neck      Neck Exercises: Theraband   Shoulder Extension --    Shoulder Extension Limitations --    Rows --    Shoulder External Rotation --      Neck Exercises: Standing   Other Standing Exercises Cervical rotation & lateral bending AROM (SBP 1st) 10X 10 seconds each    Other Standing Exercises Shoulder blade pinches (SBP) 10X 5  seconds & SBP with shrug 10X 5 seconds      Neck Exercises: Seated   Cervical Isometrics Extension;5 secs;10 reps    Other Seated Exercise --      Neck Exercises: Prone   Other Prone Exercise Prone 90 degrees with full shoulder ER (lift legs too with knees staright and toes pulled back) 10X 3 seconds    Other Prone Exercise Prone opposite arm and leg extension (palms in and toes pulled back) 10X 3 seconds & prone superman (B arm & leg extensions) 5X 5 seconds      Modalities   Modalities Traction      Traction   Type of Traction Cervical    Min (lbs) 0    Max (lbs) 26    Hold Time Static 8 minutes    Rest Time Static    Time 10 minutes (1 minute rise/fall)      Manual Therapy   Manual therapy comments STM with active compression and skilled palpation with DN                     PT Education - 01/26/22 1734     Education Details Reviewed cervical exam findings and made recommendations to see Dr. Ernestina Patches for his opinion.  Progressed and updated HEP.  DN to neck and increased cervical traction.    Person(s) Educated Patient    Methods Explanation;Demonstration;Verbal cues;Handout    Comprehension Verbalized understanding;Returned demonstration;Verbal cues required;Need further instruction              PT Short Term Goals - 01/26/22 1735       PT SHORT TERM GOAL #1   Title Improve cervical extension AROM to 65 degrees; lateral bending to 25 degrees and rotation to 50-60 degrees.    Baseline 60; 15/20; 65/65 degrees respectively (was 60; 15-20; 40-45 degrees respectively)    Time 2    Period Weeks    Status Revised    Target Date 02/09/22               PT Long Term Goals - 01/26/22 1736       PT LONG TERM GOAL #1   Title Improve FOTO to 70.    Baseline 61 (was 58)    Time 4    Period Weeks  Status Revised    Target Date 02/23/22      PT LONG TERM GOAL #2   Title Jason Ibarra will report neck pain consistently 0-2/10 on the Numeric Pain Rating  Scale.    Baseline 6/10 (was 5/10)    Time 4    Period Weeks    Status Revised    Target Date 02/23/22      PT LONG TERM GOAL #3   Title Improve cervical extension strength to 45 pounds or more.    Baseline 43.6 (was 35.1 pounds)    Time 4    Period Weeks    Status Revised    Target Date 02/23/22      PT LONG TERM GOAL #4   Title Jason Ibarra will be independent with his DC HEP.    Baseline Updated today    Time 4    Period Weeks    Status Revised    Target Date 02/23/22                   Plan - 01/26/22 1739     Clinical Impression Statement Jason Ibarra is making intermittent progress with his scapular pain.  I believe symptoms are centralizing but inconsistent postural awareness may be affecting the pace of progress.  We reinforced the importance of staying out of slouched postures and progressed the strength component of his HEP.  I think cervical traction may help periperal symptoms (2 times with traction thus far) and my colleague is using dry needling for cervical muscular pain.  I also recommended Jason Ibarra schedule an appointment with Dr. Magnus Sinning to get a spine specialist's opinion.  Consistent attendance in PT (6 visits in 7 weeks thus far), consistent HEP acompliance and postural awareness and the opinion of a specialist will still make Jason Ibarra's prognosis good with the recommended POC.    Personal Factors and Comorbidities Comorbidity 1    Comorbidities Previous RTC repair.    Examination-Activity Limitations Bend;Sit;Carry    Examination-Participation Restrictions Community Activity;Driving    Stability/Clinical Decision Making Stable/Uncomplicated    Rehab Potential Good    PT Frequency 2x / week   1-2X/week   PT Duration 4 weeks    PT Treatment/Interventions ADLs/Self Care Home Management;Electrical Stimulation;Cryotherapy;Moist Heat;Traction;Therapeutic activities;Neuromuscular re-education;Therapeutic exercise;Patient/family education;Manual techniques;Dry  needling    PT Next Visit Plan Continue to progress cervical, mid-back, posterior RTC and scapular strength, practical body mechanics and postural education.  Cervical traction and dry needling.    PT Home Exercise Plan [email or web address]    Consulted and Agree with Plan of Care Patient             Patient will benefit from skilled therapeutic intervention in order to improve the following deficits and impairments:  Decreased activity tolerance, Decreased endurance, Decreased range of motion, Decreased strength, Increased edema, Increased fascial restricitons, Increased muscle spasms, Impaired perceived functional ability, Impaired UE functional use, Improper body mechanics, Postural dysfunction, Pain  Visit Diagnosis: Abnormal posture  Cervicalgia  Localized edema  Muscle weakness (generalized)     Problem List Patient Active Problem List   Diagnosis Date Noted   Personal history of colonic polyps 06/29/2014   ED (erectile dysfunction) of non-organic origin 06/29/2014   BPH (benign prostatic hyperplasia) 03/24/2013   Hyperlipidemia with target LDL less than 130 03/24/2013   History of herpes genitalis 03/24/2013    Farley Ly, PT, MPT 01/26/2022, 5:44 PM  [facility] [address] [place], [place], [address] Phone: [phone]   Fax:  [phone]  Name: TRYONE KILLE MRN: [id number] Date of Birth: [birth year]

## 2022-01-26 NOTE — Patient Instructions (Signed)
Access Code: [email or web address] URL: [email or web address] Date: 01/26/2022 Prepared by: Vista Mink  Exercises Standing Isometric Cervical Extension with Manual Resistance - 5 x daily - 7 x weekly - 1 sets - 5 reps - 5 hold Seated Cervical Rotation AROM - 1-2 x daily - 7 x weekly - 1 sets - 10 reps - 5 seconds hold Standing Scapular Retraction - 5 x daily - 7 x weekly - 1 sets - 5 reps - 5 second hold Standing Shoulder Internal Rotation Stretch with Hands Behind Back - 1-2 x daily - 7 x weekly - 1 sets - 10 reps - 10 seconds hold Standing Shoulder Posterior Capsule Stretch - 1-2 x daily - 7 x weekly - 1 sets - 10 reps - 10 hold Scapular Retraction with Resistance - 1 x daily - 3 x weekly - 1 sets - 20 reps - 3 seconds hold Shoulder External Rotation with Anchored Resistance with Towel Under Elbow - 1 x daily - 3 x weekly - 2-3 sets - 10 reps - 3 seconds hold Prone Shoulder Horizontal Abduction with Thumbs Up - 1 x daily - 7 x weekly - 3 sets - 5 reps - 3 seconds hold Prone Alternating Arm and Leg Lifts - 1 x daily - 7 x weekly - 1-2 sets - 10 reps - 3-10 seconds hold Shoulder Extension with Resistance Hands Down - 1 x daily - 3 x weekly - 1 sets - 10 reps - 3 seconds hold Neck Sidebending - 2 x daily - 7 x weekly - 1 sets - 10 reps - 10 seconds hold Full Superman on Table - 1 x daily - 7 x weekly - 1 sets - 5 reps - 5 seconds hold

## 2022-01-26 NOTE — Telephone Encounter (Signed)
Patient would like an appointment with Dr. Ernestina Patches. His call back number is [phone]

## 2022-02-01 ENCOUNTER — Encounter: Payer: Self-pay | Admitting: Rehabilitative and Restorative Service Providers"

## 2022-02-01 ENCOUNTER — Ambulatory Visit: Payer: Medicare Other | Admitting: Rehabilitative and Restorative Service Providers"

## 2022-02-01 ENCOUNTER — Other Ambulatory Visit: Payer: Self-pay

## 2022-02-01 DIAGNOSIS — M542 Cervicalgia: Secondary | ICD-10-CM | POA: Diagnosis not present

## 2022-02-01 DIAGNOSIS — R600 Localized edema: Secondary | ICD-10-CM | POA: Diagnosis not present

## 2022-02-01 DIAGNOSIS — M6281 Muscle weakness (generalized): Secondary | ICD-10-CM | POA: Diagnosis not present

## 2022-02-01 DIAGNOSIS — R293 Abnormal posture: Secondary | ICD-10-CM

## 2022-02-01 NOTE — Patient Instructions (Signed)
Continue current HEP 

## 2022-02-01 NOTE — Therapy (Signed)
[facility] [address] [place], [place], [address] Phone: [phone]   Fax:  [phone]  Physical Therapy Treatment  Patient Details  Name: Jason Ibarra MRN: [id number] Date of Birth: 1956/05/20 Referring Provider (PT): Denita Lung MD   Encounter Date: 02/01/2022   PT End of Session - 02/01/22 1418     Visit Number 7    Number of Visits 10    Date for PT Re-Evaluation 01/18/22    Progress Note Due on Visit 10    PT Start Time 1330    PT Stop Time 1425    PT Time Calculation (min) 55 min    Activity Tolerance Patient tolerated treatment well;No increased pain;Patient limited by fatigue    Behavior During Therapy [facility] for tasks assessed/performed             Past Medical History:  Diagnosis Date   Abdominal pain    Dyslipidemia    ED (erectile dysfunction)    Hemorrhoids    Herpes genitalis in men    Hiatal hernia    Hyperlipidemia    borderline per patient   Rectal pain     Past Surgical History:  Procedure Laterality Date   RETINAL DETACHMENT [place]   SKIN BIOPSY Left 10/13/2019   left clavicle pigmented seborrheic keratosis   SKIN BIOPSY Right 10/13/2019   right miid chest  pigmented seborrheic keratosis   TONSILLECTOMY  1962    There were no vitals filed for this visit.   Subjective Assessment - 02/01/22 1338     Subjective Jason Ibarra is taking ibuprofen PRN.  Scapular symptoms are better but present.  No change with cervical symptoms.  He is doing the exercises and walking.    Pertinent History Previous RTC repair    Limitations Sitting;Reading;Lifting;House hold activities    How long can you sit comfortably? Doesn't seem to notice a difference    Patient Stated Goals Return to golf and full function without being limited by neck pain.    Currently in Pain? Yes    Pain Score 6     Pain Location Neck    Pain Orientation Right    Pain Descriptors /  Indicators Aching;Tightness;Constant    Pain Type Chronic pain    Pain Radiating Towards Mostly upper trapezius and neck although some mid-scapular and arm fatigue.    Pain Onset More than a month ago    Pain Frequency Intermittent    Aggravating Factors  Flexed postures    Pain Relieving Factors Exercise, postural awareness and ibuprofen    Effect of Pain on Daily Activities Limits comfort with physically demanding activities (golf)    Multiple Pain Sites No                               [facility] Adult PT Treatment/Exercise - 02/01/22 0001       Posture/Postural Control   Posture/Postural Control Postural limitations    Postural Limitations Forward head;Rounded Shoulders;Decreased lumbar lordosis      Exercises   Exercises Neck      Neck Exercises: Theraband   Shoulder Extension 10 reps;Red;Limitations    Shoulder Extension Limitations Palms up, elbows straight, pinch scapulae    Rows 20 reps;Blue    Shoulder External Rotation 10 reps;Green      Neck Exercises: Standing   Other Standing Exercises Cervical rotation & lateral bending  AROM (SBP 1st) 10X 10 seconds each    Other Standing Exercises Shoulder blade pinches (SBP) 10X 5 seconds & SBP with shrug 10X 5 seconds      Neck Exercises: Seated   Cervical Isometrics Extension;5 secs;10 reps    Other Seated Exercise Thumb up the back stretch 10X 10 seconds and posterior capsule stretch 10X 10 seconds      Neck Exercises: Prone   Other Prone Exercise Prone 90 degrees with full shoulder ER (lift legs too with knees staright and toes pulled back) 10X 3 seconds    Other Prone Exercise Prone opposite arm and leg extension (palms in and toes pulled back) 10X 3 seconds & prone superman (B arm & leg extensions) 5X 5 seconds      Modalities   Modalities Traction      Traction   Type of Traction Cervical    Min (lbs) 0    Max (lbs) 27    Hold Time Static 8 minutes    Rest Time Static    Time 10 minutes (1 minute  rise/fall)      Manual Therapy   Manual therapy comments STM with active compression and skilled palpation with DN              Trigger Point Dry Needling - 02/01/22 0001     Consent Given? Yes    Education Handout Provided Previously provided    Muscles Treated Head and Neck Cervical multifidi;Semispinalis capitus   thoracic paraspinals   Dry Needling Comments good overall tolerance to intervention, twitch response elicited, DN performed by Elsie Ra PT,DPT                   PT Education - 02/01/22 1419     Education Details Reviewed HEP with emphasis on keeping head in neutral with all exercises.    Person(s) Educated Patient    Methods Explanation;Verbal cues    Comprehension Returned demonstration;Need further instruction;Verbal cues required;Verbalized understanding              PT Short Term Goals - 01/26/22 1735       PT SHORT TERM GOAL #1   Title Improve cervical extension AROM to 65 degrees; lateral bending to 25 degrees and rotation to 50-60 degrees.    Baseline 60; 15/20; 65/65 degrees respectively (was 60; 15-20; 40-45 degrees respectively)    Time 2    Period Weeks    Status Revised    Target Date 02/09/22               PT Long Term Goals - 01/26/22 1736       PT LONG TERM GOAL #1   Title Improve FOTO to 70.    Baseline 61 (was 58)    Time 4    Period Weeks    Status Revised    Target Date 02/23/22      PT LONG TERM GOAL #2   Title Jason Ibarra will report neck pain consistently 0-2/10 on the Numeric Pain Rating Scale.    Baseline 6/10 (was 5/10)    Time 4    Period Weeks    Status Revised    Target Date 02/23/22      PT LONG TERM GOAL #3   Title Improve cervical extension strength to 45 pounds or more.    Baseline 43.6 (was 35.1 pounds)    Time 4    Period Weeks    Status Revised    Target Date 02/23/22  PT LONG TERM GOAL #4   Title Jason Ibarra will be independent with his DC HEP.    Baseline Updated today    Time 4     Period Weeks    Status Revised    Target Date 02/23/22                   Plan - 02/01/22 1420     Clinical Impression Statement Progress is noted, although it is slow.  R scapular symptoms have improved, although Jason Ibarra can still get scapular symptoms along with R UE "weakness" and neck pain.  Objective weakness is not noted with cervical and shoulder strength (see previous reassessments) although Jason Ibarra reports weakness when experiencing a flare-up.  He is seeing Dr. Ernestina Patches next week for his recommendations and he will benefit from continued postural, cervical and scapular strengthening as weaknesses are noted and are likely contributing to his condition.    Personal Factors and Comorbidities Comorbidity 1    Comorbidities Previous RTC repair.    Examination-Activity Limitations Bend;Sit;Carry    Examination-Participation Restrictions Community Activity;Driving    Stability/Clinical Decision Making Stable/Uncomplicated    Rehab Potential Good    PT Frequency 2x / week   1-2X/week   PT Duration 4 weeks    PT Treatment/Interventions ADLs/Self Care Home Management;Electrical Stimulation;Cryotherapy;Moist Heat;Traction;Therapeutic activities;Neuromuscular re-education;Therapeutic exercise;Patient/family education;Manual techniques;Dry needling    PT Next Visit Plan Continue to progress cervical, mid-back, posterior RTC and scapular strength, practical body mechanics and postural education.  Cervical traction and dry needling.    PT Home Exercise Plan [email or web address]    Consulted and Agree with Plan of Care Patient             Patient will benefit from skilled therapeutic intervention in order to improve the following deficits and impairments:  Decreased activity tolerance, Decreased endurance, Decreased range of motion, Decreased strength, Increased edema, Increased fascial restricitons, Increased muscle spasms, Impaired perceived functional ability, Impaired UE functional use,  Improper body mechanics, Postural dysfunction, Pain  Visit Diagnosis: Abnormal posture  Cervicalgia  Localized edema  Muscle weakness (generalized)     Problem List Patient Active Problem List   Diagnosis Date Noted   Personal history of colonic polyps 06/29/2014   ED (erectile dysfunction) of non-organic origin 06/29/2014   BPH (benign prostatic hyperplasia) 03/24/2013   Hyperlipidemia with target LDL less than 130 03/24/2013   History of herpes genitalis 03/24/2013    Debbe Odea, PT, DPT 02/01/2022, 2:42 PM Vista Mink,  PT, MPT 02/01/22 2:42 PM  [facility] [address] [place], [place], [address] Phone: [phone]   Fax:  [phone]  Name: Jason Ibarra MRN: [id number] Date of Birth: 1956/08/01

## 2022-02-03 ENCOUNTER — Ambulatory Visit: Payer: Medicare Other | Admitting: Rehabilitative and Restorative Service Providers"

## 2022-02-03 ENCOUNTER — Other Ambulatory Visit: Payer: Self-pay

## 2022-02-03 ENCOUNTER — Encounter: Payer: Self-pay | Admitting: Rehabilitative and Restorative Service Providers"

## 2022-02-03 DIAGNOSIS — M6281 Muscle weakness (generalized): Secondary | ICD-10-CM

## 2022-02-03 DIAGNOSIS — R293 Abnormal posture: Secondary | ICD-10-CM | POA: Diagnosis not present

## 2022-02-03 DIAGNOSIS — R600 Localized edema: Secondary | ICD-10-CM | POA: Diagnosis not present

## 2022-02-03 DIAGNOSIS — M542 Cervicalgia: Secondary | ICD-10-CM

## 2022-02-03 NOTE — Patient Instructions (Signed)
Access Code: [email or web address] URL: [email or web address] Date: 02/03/2022 Prepared by: Scot Jun  Exercises Standing Isometric Cervical Extension with Manual Resistance - 5 x daily - 7 x weekly - 1 sets - 5 reps - 5 hold Seated Cervical Rotation AROM - 1-2 x daily - 7 x weekly - 1 sets - 10 reps - 5 seconds hold Standing Scapular Retraction - 5 x daily - 7 x weekly - 1 sets - 5 reps - 5 second hold Standing Shoulder Internal Rotation Stretch with Hands Behind Back - 1-2 x daily - 7 x weekly - 1 sets - 10 reps - 10 seconds hold Standing Shoulder Posterior Capsule Stretch - 1-2 x daily - 7 x weekly - 1 sets - 10 reps - 10 hold Scapular Retraction with Resistance - 1 x daily - 3 x weekly - 1 sets - 20 reps - 3 seconds hold Shoulder External Rotation with Anchored Resistance with Towel Under Elbow - 1 x daily - 3 x weekly - 2-3 sets - 10 reps - 3 seconds hold Prone Shoulder Horizontal Abduction with Thumbs Up - 1 x daily - 7 x weekly - 3 sets - 5 reps - 3 seconds hold Prone Alternating Arm and Leg Lifts - 1 x daily - 7 x weekly - 1-2 sets - 10 reps - 3-10 seconds hold Shoulder Extension with Resistance Hands Down - 1 x daily - 3 x weekly - 1 sets - 10 reps - 3 seconds hold Full Superman on Table - 1 x daily - 7 x weekly - 1 sets - 5 reps - 5 seconds hold Seated Gentle Upper Trapezius Stretch - 2 x daily - 7 x weekly - 1 sets - 5 reps - 15 hold Sternocleidomastoid Stretch - 2 x daily - 7 x weekly - 1 sets - 5 reps - 15 hold

## 2022-02-03 NOTE — Therapy (Signed)
[place], [place], [address] Phone: [phone]   Fax:  [phone]  Physical Therapy Treatment  Patient Details  Name: Jason Ibarra MRN: [id number] Date of Birth: 1956/02/14 Referring Provider (PT): Denita Lung MD   Encounter Date: 02/03/2022   PT End of Session - 02/03/22 1056     Visit Number 8    Number of Visits 10    Date for PT Re-Evaluation 02/23/22    Progress Note Due on Visit 17    PT Start Time 1011    PT Stop Time 1100    PT Time Calculation (min) 49 min    Activity Tolerance Patient tolerated treatment well;No increased pain    Behavior During Therapy WFL for tasks assessed/performed             Past Medical History:  Diagnosis Date   Abdominal pain    Dyslipidemia    ED (erectile dysfunction)    Hemorrhoids    Herpes genitalis in men    Hiatal hernia    Hyperlipidemia    borderline per patient   Rectal pain     Past Surgical History:  Procedure Laterality Date   RETINAL DETACHMENT [place]   SKIN BIOPSY Left 10/13/2019   left clavicle pigmented seborrheic keratosis   SKIN BIOPSY Right 10/13/2019   right miid chest  pigmented seborrheic keratosis   TONSILLECTOMY  1962    There were no vitals filed for this visit.   Subjective Assessment - 02/03/22 1017     Subjective Pt. indicated having complaints primarly on Rt side of neck upon arrival today.  Pt. indicated having variable instances of dizziness and occasional ringing in ear at tmes as well but didn't report a consistent movmeent connection.    Pertinent History Previous RTC repair    Limitations Sitting;Reading;Lifting;House hold activities    How long can you sit comfortably? Doesn't seem to notice a difference    Patient Stated Goals Return to golf and full function without being limited by neck pain.    Currently in Pain? Yes    Pain Score 4     Pain Location Neck     Pain Orientation Right    Pain Descriptors / Indicators Aching;Tightness;Throbbing;Contraction    Pain Onset More than a month ago    Aggravating Factors  static positioning, general neck movement at times, workouts    Pain Relieving Factors stretching helps with tightness, medicine as needed.                               [place] Adult PT Treatment/Exercise - 02/03/22 0001       Exercises   Other Exercises  General verbal review of existing HEP (held performance to allow additional manual treatment time today)      Neck Exercises: Machines for Strengthening   UBE (Upper Arm Bike) lvl 3 3 mins fwd/back each way      Neck Exercises: Stretches   Other Neck Stretches Rt upper trap stretch 15 sec x 5, SCM stretch 15 sec x 5 Rt      Modalities   Modalities Electrical Stimulation      Electrical Stimulation   Electrical Stimulation Location Rt upper trap    Electrical Stimulation Action pre mod    Electrical Stimulation Parameters 10 mins to tolerance c moist heat pad  Electrical Stimulation Goals Pain      Manual Therapy   Manual therapy comments Compression to Rt upper trap, Rt SCM (cues for home use).              Trigger Point Dry Needling - 02/03/22 0001     Consent Given? Yes    Education Handout Provided Previously provided    Muscles Treated Head and Neck Upper trapezius   Rt   Upper Trapezius Response Twitch reponse elicited                   PT Education - 02/03/22 1055     Education Details SCM, upper trap self stretch, cues for active compression use at home.    Person(s) Educated Patient    Methods Explanation;Demonstration;Verbal cues;Handout    Comprehension Verbalized understanding;Returned demonstration              PT Short Term Goals - 01/26/22 1735       PT SHORT TERM GOAL #1   Title Improve cervical extension AROM to 65 degrees; lateral bending to 25 degrees and rotation to 50-60 degrees.    Baseline 60;  15/20; 65/65 degrees respectively (was 60; 15-20; 40-45 degrees respectively)    Time 2    Period Weeks    Status Revised    Target Date 02/09/22               PT Long Term Goals - 01/26/22 1736       PT LONG TERM GOAL #1   Title Improve FOTO to 70.    Baseline 61 (was 58)    Time 4    Period Weeks    Status Revised    Target Date 02/23/22      PT LONG TERM GOAL #2   Title Jason Ibarra will report neck pain consistently 0-2/10 on the Numeric Pain Rating Scale.    Baseline 6/10 (was 5/10)    Time 4    Period Weeks    Status Revised    Target Date 02/23/22      PT LONG TERM GOAL #3   Title Improve cervical extension strength to 45 pounds or more.    Baseline 43.6 (was 35.1 pounds)    Time 4    Period Weeks    Status Revised    Target Date 02/23/22      PT LONG TERM GOAL #4   Title Jason Ibarra will be independent with his DC HEP.    Baseline Updated today    Time 4    Period Weeks    Status Revised    Target Date 02/23/22                   Plan - 02/03/22 1035     Clinical Impression Statement Primary complaints from Pt. upon arrival today was related to Rt upper to mid cervical region including SCM attachment near ear.  Palpation to area revealed concordant symptoms from Rt SCM and Rt upper trap.  After review with Pt., Pt. selected approach today to address myofascial complaints in area with manual and dry needling options.  Pt. tolerated fairly and voiced desire for use today.  Due to amount, adiditional myofascial post needle soreness to be noted.  Discussed and education with Pt. about use of heat/ice, mobility stretching and OTC medicine as able to help reduce after soreness complaints.  Due to timing, cervical traction while agreed upon today to hold.    Personal Factors and Comorbidities  Comorbidity 1    Comorbidities Previous RTC repair.    Examination-Activity Limitations Bend;Sit;Carry    Examination-Participation Restrictions Community Activity;Driving     Stability/Clinical Decision Making Stable/Uncomplicated    Rehab Potential Good    PT Frequency 2x / week   1-2X/week   PT Duration 4 weeks    PT Treatment/Interventions ADLs/Self Care Home Management;Electrical Stimulation;Cryotherapy;Moist Heat;Traction;Therapeutic activities;Neuromuscular re-education;Therapeutic exercise;Patient/family education;Manual techniques;Dry needling    PT Next Visit Plan Recheck dry needling response and use as Pt. desired.  Return to traction if indicated with continued strengthening, mobility gains through ther ex.    PT Home Exercise Plan [id number]    Consulted and Agree with Plan of Care Patient             Patient will benefit from skilled therapeutic intervention in order to improve the following deficits and impairments:  Decreased activity tolerance, Decreased endurance, Decreased range of motion, Decreased strength, Increased edema, Increased fascial restricitons, Increased muscle spasms, Impaired perceived functional ability, Impaired UE functional use, Improper body mechanics, Postural dysfunction, Pain  Visit Diagnosis: Abnormal posture  Cervicalgia  Localized edema  Muscle weakness (generalized)     Problem List Patient Active Problem List   Diagnosis Date Noted   Personal history of colonic polyps 06/29/2014   ED (erectile dysfunction) of non-organic origin 06/29/2014   BPH (benign prostatic hyperplasia) 03/24/2013   Hyperlipidemia with target LDL less than 130 03/24/2013   History of herpes genitalis 03/24/2013    Scot Jun, PT, DPT, OCS, ATC 02/03/22  10:58 AM    [facility] [address] [place], [place], [address] Phone: [phone]   Fax:  [phone]  Name: Jason Ibarra MRN: [id number] Date of Birth: April 10, 1956

## 2022-02-06 ENCOUNTER — Other Ambulatory Visit: Payer: Self-pay

## 2022-02-06 ENCOUNTER — Ambulatory Visit: Payer: Medicare Other | Admitting: Physical Medicine and Rehabilitation

## 2022-02-06 ENCOUNTER — Encounter: Payer: Self-pay | Admitting: Physical Medicine and Rehabilitation

## 2022-02-06 VITALS — BP 115/61 | HR 76

## 2022-02-06 DIAGNOSIS — M2420 Disorder of ligament, unspecified site: Secondary | ICD-10-CM | POA: Diagnosis not present

## 2022-02-06 DIAGNOSIS — M7918 Myalgia, other site: Secondary | ICD-10-CM

## 2022-02-06 DIAGNOSIS — M542 Cervicalgia: Secondary | ICD-10-CM

## 2022-02-06 DIAGNOSIS — M5412 Radiculopathy, cervical region: Secondary | ICD-10-CM

## 2022-02-06 DIAGNOSIS — H9311 Tinnitus, right ear: Secondary | ICD-10-CM

## 2022-02-06 MED ORDER — DULOXETINE HCL 30 MG PO CPEP: ORAL_CAPSULE | ORAL | 0 refills | Status: DC

## 2022-02-06 NOTE — Progress Notes (Signed)
Jason Ibarra - 66 y.o. male MRN [id number]  Date of birth: 09-25-1956  Office Visit Note: Visit Date: 02/06/2022 PCP: Denita Lung, MD Referred by: Denita Lung, MD  Subjective: Chief Complaint  Patient presents with   Neck - Pain   Right Shoulder - Pain   HPI: Jason Ibarra is a 66 y.o. male who comes in today as a self-referral for evaluation of chronic, worsening and severe right sided neck pain radiating to right jaw and down anterior neck to chest. Patient also reports tightness to bilateral shoulders/upper back, dizziness and ringing in right ear. Patient reports symptoms have been ongoing intermittently for 30 years, but have worsened over the last several months. Patient reports symptoms are exacerbated by movement and activity, describes as a constant soreness sensation, currently rates as 7 out of 10. Patient reports some relief of pain with heating pad and OTC medications. Patient currently undergoing formal physical therapy with our in house team and reports significant relief of muscle tightness to shoulders/upper back with dry needling treatments. Patients cervical x-ray images from 2022 exhibits normal cervical alignment, no fractures. There is also evidence of elongated stylohyoid bone that is not mentioned on imaging report. Patient does not have history of formal MRI imaging of cervical spine. He denies history of cervical spine injury/surgery. Patient states he has been dealing with this pain for many years, however he recently retired and noticed his pain has become more severe. Patient states he did consult with ENT many years ago about chronic issues with ringing in right ear and dizziness, however patient has not been seen recently.  Patient is routinely followed by his primary care provider Dr. Jill Alexanders. Patient denies focal weakness, numbness and tingling. Patient denies recent trauma or falls.   Review of Systems  HENT:  Positive for tinnitus.    Musculoskeletal:  Positive for myalgias and neck pain.  Neurological:  Positive for dizziness. Negative for tingling, sensory change, focal weakness and weakness.  All other systems reviewed and are negative. Otherwise per HPI.  Assessment & Plan: Visit Diagnoses:    ICD-10-CM   1. Radiculopathy, cervical region  M54.12     2. Cervicalgia  M54.2 MR CERVICAL SPINE WO CONTRAST    3. Myofascial pain syndrome  M79.18     4. 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 There is also upper back and bilateral shoulder soreness, dizziness and right sided tinnitus. Patient continues to have excruciating pain despite good conservative therapies such as formal physical therapy, heating pad and use of OTC medications as needed. Patients clinical presentation and exam are complex and differentials could be cervical radiculopathy/cervicalgia, myofascial pain syndrome and/or stylohyoid syndrome (Eagle's Syndrome). We feel the next step is to place order for cervical MRI imaging which we did complete today. We feel that a referral to ENT would also be useful, consult placed with Dr. Melissa Montane at [facility]. We did discuss medication management in detail including the use of anticonvulsants and medications such as norepinephrine modulating medications and placed a prescription for duloxetine today, patient instructed to take this medication at bedtime and to taper up as directed after 2 weeks if he is tolerating without difficulty.  This would be to  start at 30 mg titrating to 60 mg.  Consideration of nortriptyline as well as gabapentin, Topamax or Lyrica.  Consider referral to tertiary system such as [facility] or [facility].  We instructed patient to follow-up with [place] after cervical MRI is  obtained for review and to discuss further treatment options. No red flag symptoms noted upon exam today.    Meds & Orders:  Meds ordered this encounter  Medications   DULoxetine (CYMBALTA) 30 MG capsule    Sig: Take (1) capsule 30 mg by mouth at bedtime for (2) weeks, then take (2) capsules 60 mg by mouth at bedtime.    Dispense:  60 capsule    Refill:  0    Order Specific Question:   Supervising Provider    Answer:   Magnus Sinning [[id number]]    Orders Placed This Encounter  Procedures   MR CERVICAL SPINE WO CONTRAST   Ambulatory referral to ENT    Follow-up: Return for follow-up after cervical MRI imaging is complete for review.   Procedures: No procedures performed      Clinical History: EXAM: CERVICAL SPINE - COMPLETE 4+ VIEW   COMPARISON:  None.   FINDINGS: There is no evidence of cervical spine fracture or prevertebral soft tissue swelling. Alignment is normal. No other significant bone abnormalities are identified.   IMPRESSION: Negative cervical spine radiographs.     Electronically Signed   By: Ronney Asters M.D.   On: 11/28/2021 19:59   He reports that he has never smoked. He has never used smokeless tobacco. No results for input(s): HGBA1C, LABURIC in the last 8760 hours.  Objective:  VS:  HT:     WT:    BMI:      BP:115/61   HR:76bpm   TEMP: ( )   RESP:  Physical Exam Vitals and nursing note reviewed.  HENT:     Head: Normocephalic and atraumatic.     Right Ear: External ear normal.     Left Ear: External ear normal.     Nose: Nose normal.     Mouth/Throat:     Mouth: Mucous membranes are moist.  Eyes:     Extraocular Movements: Extraocular movements intact.  Cardiovascular:     Rate and Rhythm: Normal rate.     Pulses: Normal pulses.  Pulmonary:     Effort: Pulmonary effort is normal.  Abdominal:     General: Abdomen is flat. There is no distension.  Musculoskeletal:        General: Tenderness present.     Cervical back: Tenderness  present.     Comments: Discomfort noted with flexion, extension and side-to-side rotation. Patient has good strength in the upper extremities including 5 out of 5 strength in wrist extension, long finger flexion and APB.  There is no atrophy of the hands intrinsically.  Sensation intact bilaterally. Negative Hoffman's sign.   Skin:    General: Skin is warm and dry.     Capillary Refill: Capillary refill takes less than 2 seconds.  Neurological:     General: No focal deficit present.     Mental Status: He is alert and oriented to person, place, and time.  Psychiatric:        Mood and Affect: Mood normal.        Behavior: Behavior normal.    Ortho Exam  Imaging: No results found.  Past Medical/Family/Surgical/Social History: Medications & Allergies reviewed per EMR, new medications updated. Patient Active Problem List   Diagnosis  Date Noted   Personal history of colonic polyps 06/29/2014   ED (erectile dysfunction) of non-organic origin 06/29/2014   BPH (benign prostatic hyperplasia) 03/24/2013   Hyperlipidemia with target LDL less than 130 03/24/2013   History of herpes genitalis 03/24/2013   Past Medical History:  Diagnosis Date   Abdominal pain    Dyslipidemia    ED (erectile dysfunction)    Hemorrhoids    Herpes genitalis in men    Hiatal hernia    Hyperlipidemia    borderline per patient   Rectal pain    Family History  Problem Relation Age of Onset   Hypertension Father        low blood pressure   Heart disease Father 49       Arrhythmia   Cancer Paternal Aunt    Emphysema Paternal Grandmother    Cancer Maternal Grandmother        ovarian   Past Surgical History:  Procedure Laterality Date   RETINAL DETACHMENT SURGERY  1989   SHOULDER OPEN ROTATOR CUFF REPAIR  1995   SKIN BIOPSY Left 10/13/2019   left clavicle pigmented seborrheic keratosis   SKIN BIOPSY Right 10/13/2019   right miid chest  pigmented seborrheic keratosis   TONSILLECTOMY  1962   Social  History   Occupational History   Not on file  Tobacco Use   Smoking status: Never   Smokeless tobacco: Never  Substance and Sexual Activity   Alcohol use: Yes    Alcohol/week: 2.0 standard drinks    Types: 1 Cans of beer, 1 Shots of liquor per week    Comment: 2 per month   Drug use: No   Sexual activity: Yes

## 2022-02-06 NOTE — Progress Notes (Signed)
Pt state neck pain that travels to his right shoulder. Pt state any movement with his arm and turning his neck makes the pain worse.Pt state he has ringing in his right ear and dizziness. Pt state he been to PT and takes over the counter pain meds to help ease his pain.  Numeric Pain Rating Scale and Functional Assessment Average Pain 6 Pain Right Now 3 My pain is constant, burning, dull, and aching Pain is worse with: bending and some activites Pain improves with: heat/ice, therapy/exercise, and medication   In the last MONTH (on 0-10 scale) has pain interfered with the following?  1. General activity like being  able to carry out your everyday physical activities such as walking, climbing stairs, carrying groceries, or moving a chair?  Rating(7)  2. Relation with others like being able to carry out your usual social activities and roles such as  activities at home, at work and in your community. Rating(8)  3. Enjoyment of life such that you have  been bothered by emotional problems such as feeling anxious, depressed or irritable?  Rating(9)

## 2022-02-08 ENCOUNTER — Other Ambulatory Visit: Payer: Self-pay

## 2022-02-08 ENCOUNTER — Encounter: Payer: Self-pay | Admitting: Rehabilitative and Restorative Service Providers"

## 2022-02-08 ENCOUNTER — Ambulatory Visit: Payer: Medicare Other | Admitting: Rehabilitative and Restorative Service Providers"

## 2022-02-08 DIAGNOSIS — M542 Cervicalgia: Secondary | ICD-10-CM | POA: Diagnosis not present

## 2022-02-08 DIAGNOSIS — R293 Abnormal posture: Secondary | ICD-10-CM

## 2022-02-08 DIAGNOSIS — M6281 Muscle weakness (generalized): Secondary | ICD-10-CM | POA: Diagnosis not present

## 2022-02-08 DIAGNOSIS — R600 Localized edema: Secondary | ICD-10-CM | POA: Diagnosis not present

## 2022-02-08 NOTE — Therapy (Signed)
[place], [place], [address] Phone: [phone]   Fax:  [phone]  Physical Therapy Treatment  Patient Details  Name: Jason Ibarra MRN: [id number] Date of Birth: 1956-06-08 Referring Provider (PT): Jason Lung MD   Encounter Date: 02/08/2022   PT End of Session - 02/08/22 0924     Visit Number 9    Number of Visits 10    Date for PT Re-Evaluation 02/23/22    Progress Note Due on Visit 17    PT Start Time 0843    PT Stop Time 0926    PT Time Calculation (min) 43 min    Activity Tolerance Patient tolerated treatment well;No increased pain    Behavior During Therapy WFL for tasks assessed/performed             Past Medical History:  Diagnosis Date   Abdominal pain    Dyslipidemia    ED (erectile dysfunction)    Hemorrhoids    Herpes genitalis in men    Hiatal hernia    Hyperlipidemia    borderline per patient   Rectal pain     Past Surgical History:  Procedure Laterality Date   RETINAL DETACHMENT [place]   SKIN BIOPSY Left 10/13/2019   left clavicle pigmented seborrheic keratosis   SKIN BIOPSY Right 10/13/2019   right miid chest  pigmented seborrheic keratosis   TONSILLECTOMY  1962    There were no vitals filed for this visit.   Subjective Assessment - 02/08/22 0845     Subjective Pt. indicated seeing Dr. Ernestina Ibarra and was given a medicine that he hasn't felt too great while taking it so far.  Pt. indicated they discussed that his styloid process on Rt side is bigger.  Pt. indicated getting a suggestion for referral to a ear, nose and throat specialist as well as MRI.  Pt. indicated he felt a little better today than the last visit and he noticed that the Rt side of neck was better from last visit manual/dry needling and back is doing better.  Pt. indicated styloid process area pain 3-4/10.    Pertinent History Previous RTC repair    Limitations  Sitting;Reading;Lifting;House hold activities    How long can you sit comfortably? Doesn't seem to notice a difference    Patient Stated Goals Return to golf and full function without being limited by neck pain.    Currently in Pain? Yes    Pain Score 3     Pain Location Neck    Pain Orientation Right    Pain Descriptors / Indicators Aching;Sore    Pain Type Chronic pain    Pain Onset More than a month ago    Pain Frequency Constant    Aggravating Factors  continual complaints in neck    Pain Relieving Factors nothing specifc has changed the ear/jaw area                [facility] PT Assessment - 02/08/22 0001       Assessment   Medical Diagnosis Neck Pain    Referring Provider (PT) Jason Lung MD      Palpation   Palpation comment Proximal/middle 1/3 Rt SCM trigger points, upper cervical paraspinals, capitus and scalene trigger points noted on Rt                           [facility]  Adult PT Treatment/Exercise - 02/08/22 0001       Exercises   Other Exercises  Continued verbal review of existing HEP and encouragement for continued use.      Neck Exercises: Machines for Strengthening   UBE (Upper Arm Bike) lvl 3.5 3 mins fwd/back each way      Neck Exercises: Supine   Other Supine Exercise upper cervical flexion hold 5 sec hold x 15      Neck Exercises: Stretches   Other Neck Stretches Rt upper trap stretch 15 sec x 5, SCM stretch 15 sec x 5 Rt      Manual Therapy   Manual therapy comments compression to Rt proximal SCM, skilled palpation during needling              Trigger Point Dry Needling - 02/08/22 0001     Consent Given? Yes    Education Handout Provided Previously provided    Muscles Treated Head and Neck Sternocleidomastoid   Rt   Sternocleidomastoid Response Twitch response elicited                     PT Short Term Goals - 01/26/22 1735       PT SHORT TERM GOAL #1   Title Improve cervical extension AROM to 65 degrees;  lateral bending to 25 degrees and rotation to 50-60 degrees.    Baseline 60; 15/20; 65/65 degrees respectively (was 60; 15-20; 40-45 degrees respectively)    Time 2    Period Weeks    Status Revised    Target Date 02/09/22               PT Long Term Goals - 01/26/22 1736       PT LONG TERM GOAL #1   Title Improve FOTO to 70.    Baseline 61 (was 58)    Time 4    Period Weeks    Status Revised    Target Date 02/23/22      PT LONG TERM GOAL #2   Title Jason Ibarra will report neck pain consistently 0-2/10 on the Numeric Pain Rating Scale.    Baseline 6/10 (was 5/10)    Time 4    Period Weeks    Status Revised    Target Date 02/23/22      PT LONG TERM GOAL #3   Title Improve cervical extension strength to 45 pounds or more.    Baseline 43.6 (was 35.1 pounds)    Time 4    Period Weeks    Status Revised    Target Date 02/23/22      PT LONG TERM GOAL #4   Title Jason Ibarra will be independent with his DC HEP.    Baseline Updated today    Time 4    Period Weeks    Status Revised    Target Date 02/23/22                   Plan - 02/08/22 0907     Clinical Impression Statement After discussion with Pt. following recent MD visit information as well as reported improvements to mid thoracic/scapular and Rt upper trap/cervical, Pt and clinician seemed to agree on possible hold of PT while proceeding through ENT and MRI process.  Discussion today indicated cervical traction didn't seem to provide any change of chief complaint currently in ear/jaw region (so held use). Of note today, dry needling and manual performed on Rt upper cervical musculature and Rt SCM in attempt  to address remaining chief complaint of ear/jaw related complaints in area.  Pt. indicated he did feel some reduction of symptoms post manual intervention today compared to arrival.    Personal Factors and Comorbidities Comorbidity 1    Comorbidities Previous RTC repair.    Examination-Activity Limitations  Bend;Sit;Carry    Examination-Participation Restrictions Community Activity;Driving    Stability/Clinical Decision Making Stable/Uncomplicated    Rehab Potential Good    PT Frequency 2x / week   1-2X/week   PT Duration 4 weeks    PT Treatment/Interventions ADLs/Self Care Home Management;Electrical Stimulation;Cryotherapy;Moist Heat;Traction;Therapeutic activities;Neuromuscular re-education;Therapeutic exercise;Patient/family education;Manual techniques;Dry needling    PT Next Visit Plan Check DN response to SCM/Rt cervical    PT Home Exercise Plan [email or web address]    Consulted and Agree with Plan of Care Patient             Patient will benefit from skilled therapeutic intervention in order to improve the following deficits and impairments:  Decreased activity tolerance, Decreased endurance, Decreased range of motion, Decreased strength, Increased edema, Increased fascial restricitons, Increased muscle spasms, Impaired perceived functional ability, Impaired UE functional use, Improper body mechanics, Postural dysfunction, Pain  Visit Diagnosis: Abnormal posture  Cervicalgia  Localized edema  Muscle weakness (generalized)     Problem List Patient Active Problem List   Diagnosis Date Noted   Personal history of colonic polyps 06/29/2014   ED (erectile dysfunction) of non-organic origin 06/29/2014   BPH (benign prostatic hyperplasia) 03/24/2013   Hyperlipidemia with target LDL less than 130 03/24/2013   History of herpes genitalis 03/24/2013   Scot Jun, PT, DPT, OCS, ATC 02/08/22  9:39 AM     [facility] [address] [place], [place], [address] Phone: [phone]   Fax:  [phone]  Name: Jason Ibarra MRN: [id number] Date of Birth: 06/22/1956

## 2022-02-13 ENCOUNTER — Encounter: Payer: Self-pay | Admitting: Physical Medicine and Rehabilitation

## 2022-02-16 DIAGNOSIS — H2513 Age-related nuclear cataract, bilateral: Secondary | ICD-10-CM | POA: Diagnosis not present

## 2022-02-16 DIAGNOSIS — H4423 Degenerative myopia, bilateral: Secondary | ICD-10-CM | POA: Diagnosis not present

## 2022-02-16 DIAGNOSIS — H40033 Anatomical narrow angle, bilateral: Secondary | ICD-10-CM | POA: Diagnosis not present

## 2022-02-16 DIAGNOSIS — H40013 Open angle with borderline findings, low risk, bilateral: Secondary | ICD-10-CM | POA: Diagnosis not present

## 2022-02-16 DIAGNOSIS — H43811 Vitreous degeneration, right eye: Secondary | ICD-10-CM | POA: Diagnosis not present

## 2022-02-16 DIAGNOSIS — H16223 Keratoconjunctivitis sicca, not specified as Sjogren's, bilateral: Secondary | ICD-10-CM | POA: Diagnosis not present

## 2022-02-16 DIAGNOSIS — H35411 Lattice degeneration of retina, right eye: Secondary | ICD-10-CM | POA: Diagnosis not present

## 2022-02-17 ENCOUNTER — Other Ambulatory Visit: Payer: Self-pay

## 2022-02-17 ENCOUNTER — Ambulatory Visit
Admission: RE | Admit: 2022-02-17 | Discharge: 2022-02-17 | Disposition: A | Discharge status: Home / Self Care | Payer: Medicare Other | Source: Ambulatory Visit | Attending: Physical Medicine and Rehabilitation | Admitting: Physical Medicine and Rehabilitation

## 2022-02-17 DIAGNOSIS — M542 Cervicalgia: Secondary | ICD-10-CM | POA: Diagnosis not present

## 2022-02-17 DIAGNOSIS — M2578 Osteophyte, vertebrae: Secondary | ICD-10-CM | POA: Diagnosis not present

## 2022-02-17 IMAGING — MR MR CERVICAL SPINE W/O CM
4 of 5 series · 27 of 48 positions shown · non-contrast
Comparison: Cervical spine radiographs [DATE]

CLINICAL DATA: Cervicalgia. Neck pain, chronic. Patient reports
persistent right-sided pain extending into the back collarbone for
over 30 years with progression in the last 6 months.

EXAM:
MRI CERVICAL SPINE WITHOUT CONTRAST
TECHNIQUE: Multiplanar, multisequence MR imaging of the cervical spine was
performed. No intravenous contrast was administered.

[Series 5: T2 · sagittal · 3.0mm · 0.55mm/px · 6 of 15 slices shown (1 of 2)]
[im 1/15]
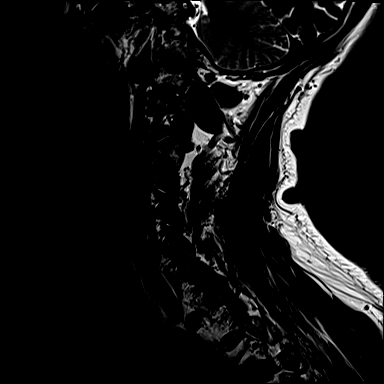
[im 3/15]
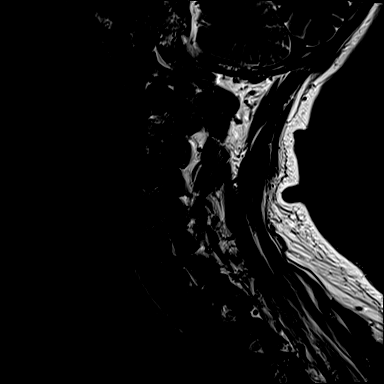
[im 6/15]
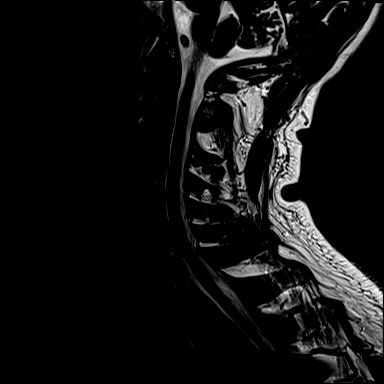
[im 9/15]
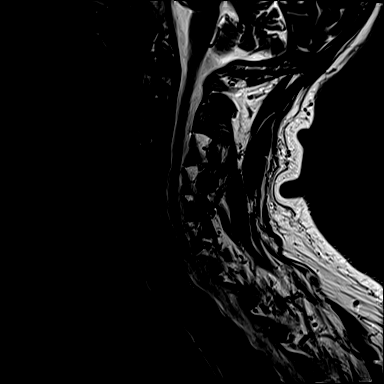
[im 12/15]
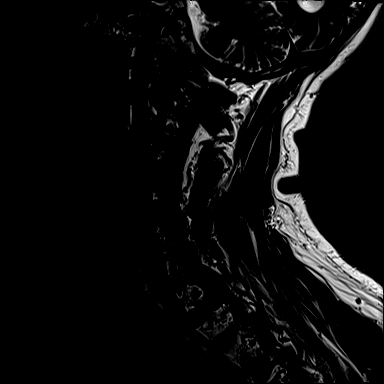
[im 15/15]
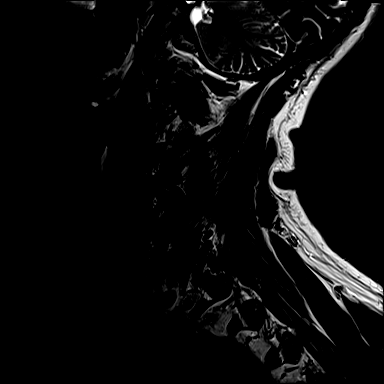

[Series 6: T1 · sagittal · 3.0mm · 0.66mm/px · 6 of 15 slices shown]
[im 1/15]
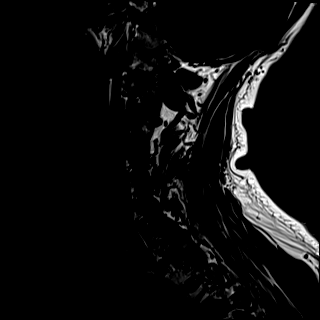
[im 3/15]
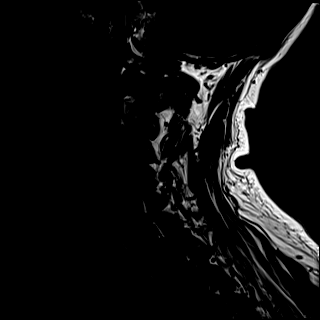
[im 6/15]
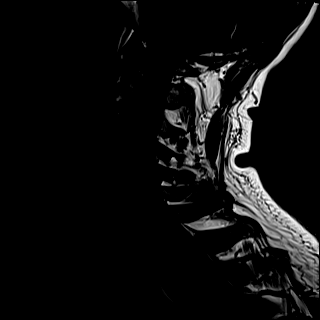
[im 9/15]
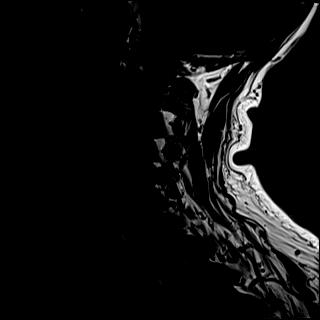
[im 12/15]
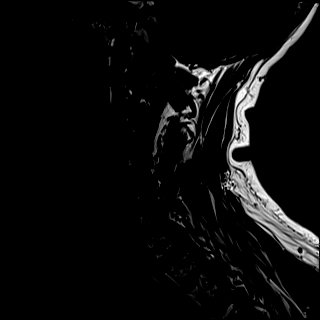
[im 15/15]
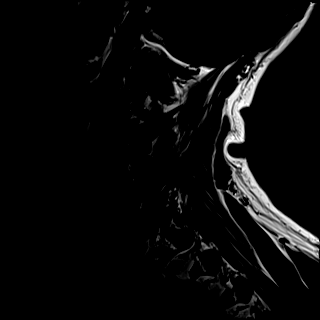

[Series 7: STIR · sagittal · 3.0mm · 0.33mm/px · 6 of 15 slices shown]
[im 1/15]
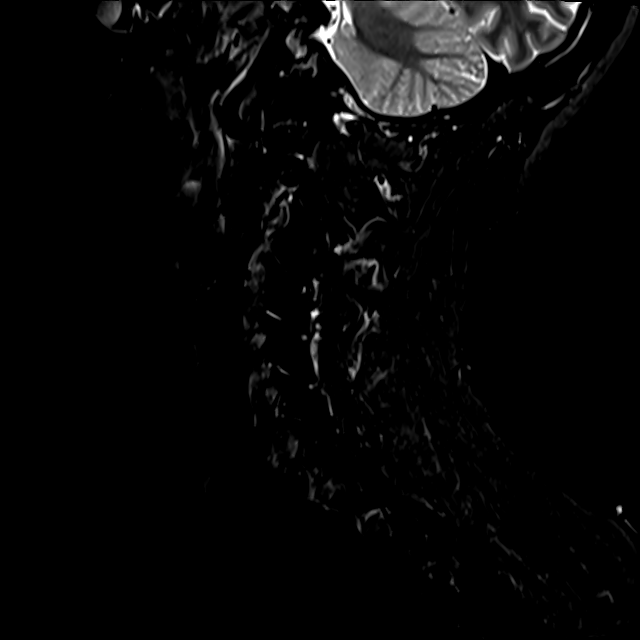
[im 3/15]
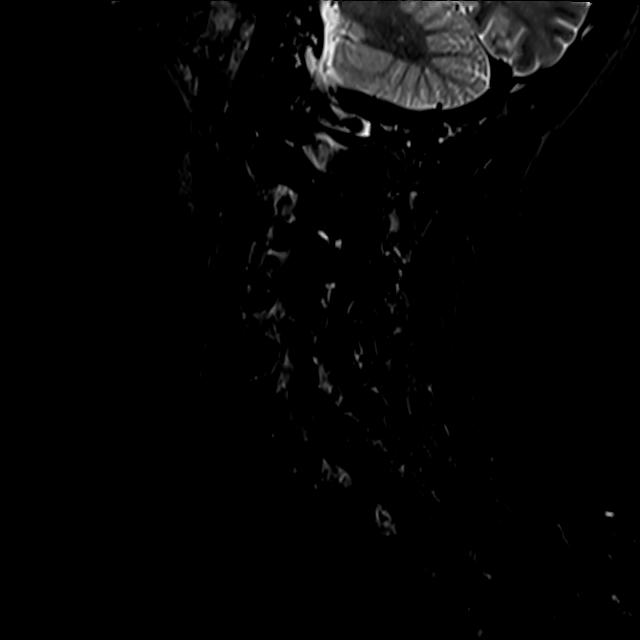
[im 6/15]
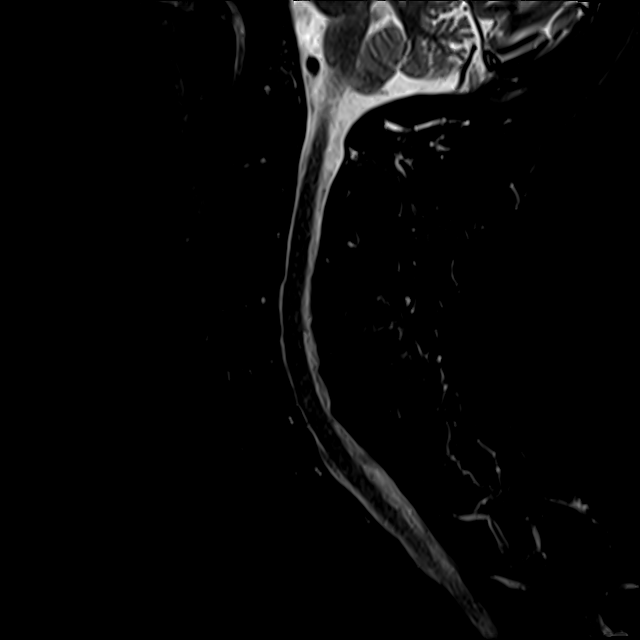
[im 9/15]
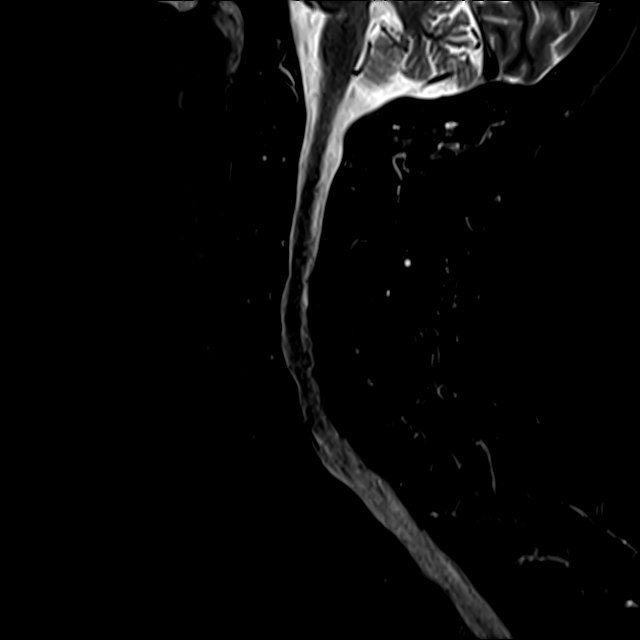
[im 12/15]
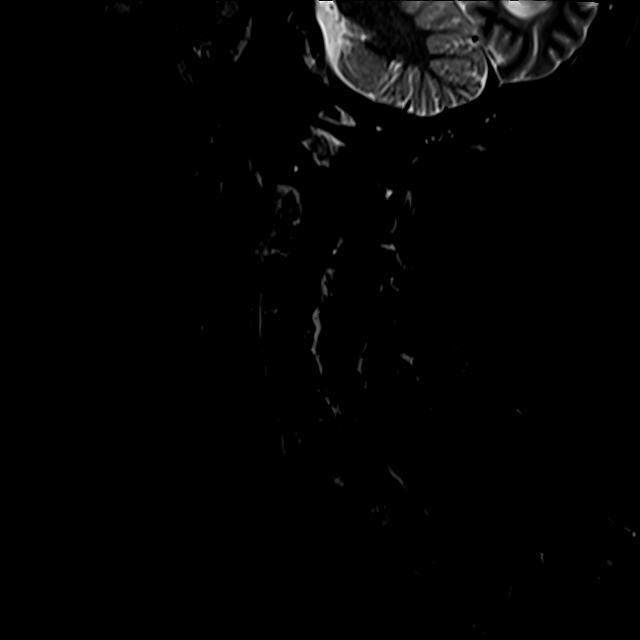
[im 15/15]
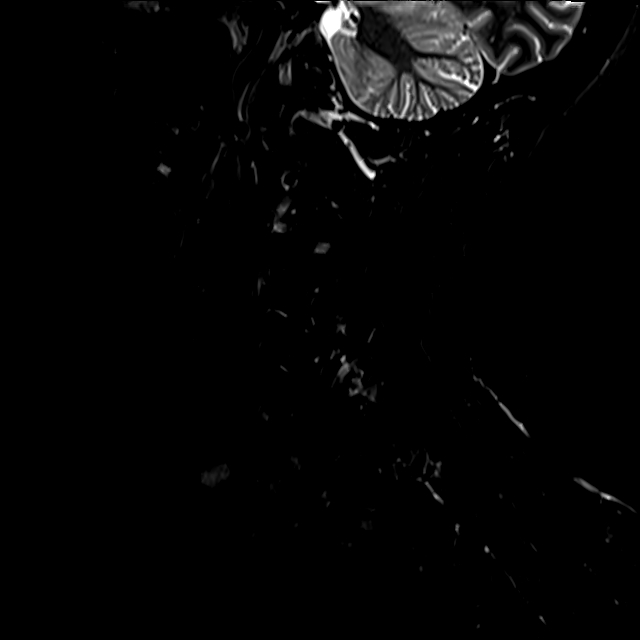

[Series 8: T2 · axial · 3.0mm · 0.50mm/px · z∈[-53,+63]mm · 9 of 37 slices shown (2 of 2)]
[im 1/37]
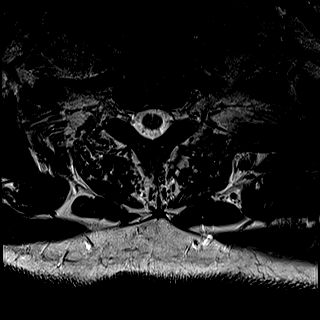
[im 6/37]
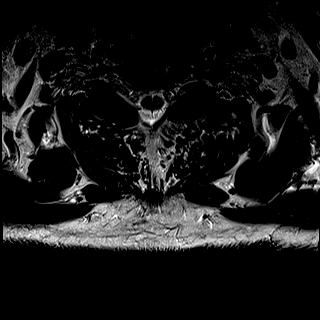
[im 11/37]
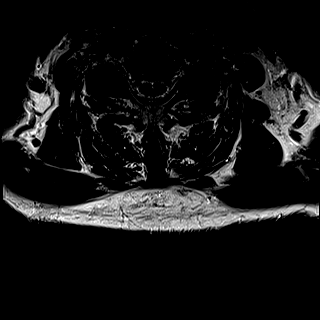
[im 16/37]
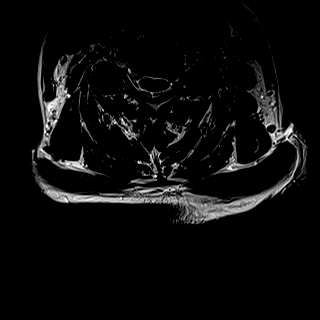
[im 19/37]
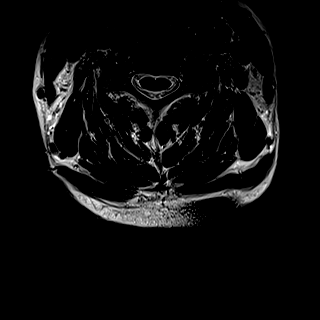
[im 21/37]
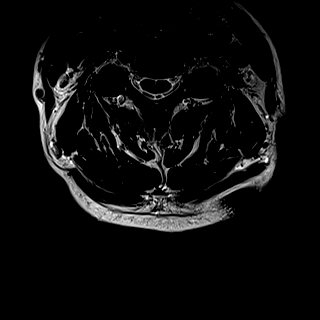
[im 26/37]
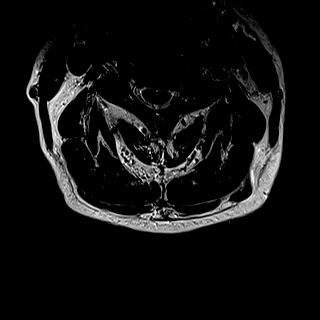
[im 31/37]
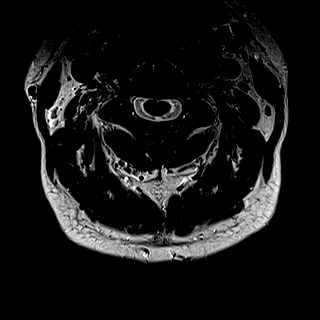
[im 37/37]
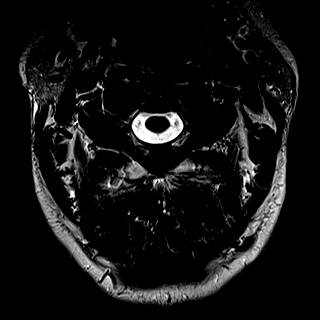

[27 of 48 positions shown; findings below may reference images not displayed]

FINDINGS: Alignment: Slight retrolisthesis is present at C3-4. No other
significant listhesis is present.

Vertebrae: Marrow signal is heterogeneous areas are marked decreased
T1 and T2 signal. Vertebral body heights are maintained.

Cord: Normal signal and morphology.

Posterior Fossa, vertebral arteries, paraspinal tissues:
Craniocervical junction is normal. Flow is present in the vertebral
arteries bilaterally. Visualized intracranial contents are normal.

Disc levels:

C2-3: Negative.

C3-4: A broad-based disc osteophyte complex is asymmetric to the
left. Partial effacement ventral CSF noted. Uncovertebral disease
results in mild foraminal narrowing bilaterally.

C4-5: Broad-based disc osteophyte complex present. Uncovertebral
spurring is present bilaterally. Facet hypertrophy is worse on the
left. Mild bilateral foraminal narrowing is present.

C5-6: A leftward disc osteophyte complex is present. Partial
effacement of the ventral CSF is noted. Mild facet hypertrophy is
noted bilaterally. Moderate left and mild right foraminal narrowing
is present.

C6-7: A broad-based disc osteophyte complex is present. Mild facet
hypertrophy is noted bilaterally. Partial effacement of ventral CSF
is noted on the right. Uncovertebral spurring contributes to
moderate right and mild left foraminal narrowing.

C7-T1: Mild facet hypertrophy is present bilaterally. No significant
disc protrusion or stenosis is present.
IMPRESSION: 1. Heterogeneous decreased marrow signal. Vertebral body heights are
maintained. In the absence of anemia, the this raises the
possibility of metastatic disease or melanoma. MRI cervical spine
with contrast or whole-body bone scan could be used for further
evaluation of osseous disease.
2. Mild foraminal narrowing bilaterally at C3-4 and C4-5.
3. Moderate left and mild right foraminal narrowing at C5-6.
4. Moderate right and mild left foraminal narrowing at C6-7.
5. Mild facet hypertrophy bilaterally at C7-T1 without significant
disc protrusion or stenosis.

These results will be called to the ordering clinician or
representative by the Radiologist Assistant, and communication
documented in the PACS or [REDACTED].

## 2022-02-20 ENCOUNTER — Encounter: Payer: Medicare Other | Admitting: Rehabilitative and Restorative Service Providers"

## 2022-02-20 ENCOUNTER — Telehealth: Payer: Self-pay | Admitting: Physical Medicine and Rehabilitation

## 2022-02-20 NOTE — Telephone Encounter (Signed)
Spoke with Dr. Jorje Guild at [facility] regarding recent cervical spine imaging results. There is heterogeneous decreased marrow signal. In the absence of anemia, this raises the possibility of metastatic disease or melanoma. MRI cervical spine with contrast or whole-body bone scan could be used for further evaluation of osseous disease. Dr. Pascal Lux recommended follow-up with patient to discuss findings and possible MRI of cervical spine with contrast. He is not concerned these findings are emergent, patient does not have history of malignancy. We will schedule patient for OV and discuss plan of care.

## 2022-02-21 ENCOUNTER — Other Ambulatory Visit: Payer: Medicare Other

## 2022-02-22 ENCOUNTER — Ambulatory Visit: Payer: Medicare Other

## 2022-02-22 ENCOUNTER — Encounter: Payer: Self-pay | Admitting: *Deleted

## 2022-02-23 ENCOUNTER — Ambulatory Visit: Payer: Medicare Other | Admitting: Physical Medicine and Rehabilitation

## 2022-02-23 ENCOUNTER — Other Ambulatory Visit: Payer: Self-pay

## 2022-02-23 ENCOUNTER — Encounter: Payer: Self-pay | Admitting: Physical Medicine and Rehabilitation

## 2022-02-23 VITALS — BP 134/88 | HR 79

## 2022-02-23 DIAGNOSIS — M5412 Radiculopathy, cervical region: Secondary | ICD-10-CM

## 2022-02-23 DIAGNOSIS — H9311 Tinnitus, right ear: Secondary | ICD-10-CM

## 2022-02-23 DIAGNOSIS — M7918 Myalgia, other site: Secondary | ICD-10-CM

## 2022-02-23 DIAGNOSIS — M2420 Disorder of ligament, unspecified site: Secondary | ICD-10-CM

## 2022-02-23 DIAGNOSIS — M542 Cervicalgia: Secondary | ICD-10-CM | POA: Diagnosis not present

## 2022-02-23 NOTE — Progress Notes (Signed)
Pt state neck pain that travels to his right shoulder. Pt state any movement with his neck and right arm makes the pain worse. Pt state he takes over the counter pain meds to help ease his pain. ? ?Numeric Pain Rating Scale and Functional Assessment ?Average Pain 7 ?Pain Right Now 4 ?My pain is constant, burning, dull, and aching ?Pain is worse with: bending, sitting, standing, and some activites ?Pain improves with: medication ? ? ?In the last MONTH (on 0-10 scale) has pain interfered with the following? ? ?1. General activity like being  able to carry out your everyday physical activities such as walking, climbing stairs, carrying groceries, or moving a chair?  ?Rating(7) ? ?2. Relation with others like being able to carry out your usual social activities and roles such as  activities at home, at work and in your community. ?Rating(8) ? ?3. Enjoyment of life such that you have  been bothered by emotional problems such as feeling anxious, depressed or irritable?  ?Rating(9) ? ?

## 2022-02-23 NOTE — Progress Notes (Signed)
Jason Ibarra - 66 y.o. male MRN [id number]  Date of birth: 1956-11-29  Office Visit Note: Visit Date: 02/23/2022 PCP: Ronnald Nian, MD Referred by: Ronnald Nian, MD  Subjective: Chief Complaint  Patient presents with   Neck - Pain   Right Shoulder - Pain   HPI: Jason Ibarra is a 66 y.o. male who comes in today for evaluation of chronic, worsening and severe right sided neck pain radiating to right jaw and down anterior neck to chest. Patient also reports tightness to bilateral shoulders/upper back, dizziness and ringing in right ear. Patient reports pain ongoing intermittently for 30 years. Patient reports pain is exacerbated by movement and activity, describes as constant soreness, currently rates as 5 out of 10. Patient reports some relief of pain with heating pad and use of OTC medications. Patient recently completed formal physical therapy with our in house team and reports good pain relief of muscle tightness with manual treatments and dry needling. Patients recent cervical MRI exhibits heterogeneous decreased marrow signal that could be indicative of metastasis or melanoma, cervical MRI with contrast is recommended. There is also multi-level facet hypertrophy, moderate left and mild right foraminal narrowing at C5-C6. No high grade spinal canal stenosis noted. Patient was prescribed Cymbalta during our last office visit and has titrated his dose to 60 mg daily, states he is tolerating without difficulty and feels his pain has decreased. Patient does have upcoming appointment with Scot Jun, PA with [facility] on April 26th for further evaluation. Patient denies focal weakness, numbness and tingling. Patient denies recent trauma or falls.   Review of Systems  HENT:  Positive for tinnitus.   Musculoskeletal:  Positive for myalgias and neck pain.  Neurological:  Positive for dizziness. Negative for tingling, sensory change, focal weakness and weakness.  All other  systems reviewed and are negative. Otherwise per HPI.  Assessment & Plan: Visit Diagnoses:    ICD-10-CM   1. Radiculopathy, cervical region  M54.12 MR CERVICAL SPINE W WO CONTRAST    2. Cervicalgia  M54.2 MR CERVICAL SPINE W WO CONTRAST    3. Myofascial pain syndrome  M79.18     4. 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 There is also upper back and bilateral shoulder soreness, dizziness and right sided tinnitus. Patient continues to have severe pain despite good conservative therapies such as formal physical therapy, use of heating pad and OTC medications. Patients clinical presentation and exam do not directly correlate with cervical MRI imaging. We can't exclude myofascial pain syndrome and/or stylohyoid syndrome (Eagle's Syndrome) at this time. We did review patients recent cervical MRI with patient and wife today using imaging and spine model. Recent cervical MRI does exhibit heterogeneous decreased marrow signal. I did consult with radiologist about these findings which do not seem to be concerning, however a cervical MRI with contrast is recommended. I spoke with patient and wife about these findings and I feel the next step is to place an order for cervical MRI with contrast to rule out any possible metastasis or other abnormal findings. We will have patient follow up after cervical MRI with contrast is obtained for review. From a cervical spine standpoint we do not feel an injection would be beneficial for patient at this time as his  symptoms do not directly correlate with cervical MRI. Patient is scheduled to follow up with ENT in April and will review those consult notes. Patient instructed to let us know if his pain worsens or changes in nature. Patient encouraged to remain active. No red flag symptoms noted upon exam today.     Meds & Orders: No orders of the defined types were placed in this encounter.   Orders Placed This Encounter  Procedures   MR CERVICAL SPINE W WO CONTRAST    Follow-up: Return for follow-up after cervical MRI with contrast for review.   Procedures: No procedures performed      Clinical History: EXAM: MRI CERVICAL SPINE WITHOUT CONTRAST   TECHNIQUE: Multiplanar, multisequence MR imaging of the cervical spine was performed. No intravenous contrast was administered.   COMPARISON:  Cervical spine radiographs 11/25/2021   FINDINGS: Alignment: Slight retrolisthesis is present at C3-4. No other significant listhesis is present.   Vertebrae: Marrow signal is heterogeneous areas are marked decreased T1 and T2 signal. Vertebral body heights are maintained.   Cord: Normal signal and morphology.   Posterior Fossa, vertebral arteries, paraspinal tissues: Craniocervical junction is normal. Flow is present in the vertebral arteries bilaterally. Visualized intracranial contents are normal.   Disc levels:   C2-3: Negative.   C3-4: A broad-based disc osteophyte complex is asymmetric to the left. Partial effacement ventral CSF noted. Uncovertebral disease results in mild foraminal narrowing bilaterally.   C4-5: Broad-based disc osteophyte complex present. Uncovertebral spurring is present bilaterally. Facet hypertrophy is worse on the left. Mild bilateral foraminal narrowing is present.   C5-6: A leftward disc osteophyte complex is present. Partial effacement of the ventral CSF is noted. Mild facet hypertrophy is noted bilaterally. Moderate left and mild right foraminal narrowing is present.   C6-7: A broad-based disc osteophyte complex is present. Mild facet hypertrophy is noted bilaterally. Partial effacement of ventral CSF is noted on the right. Uncovertebral spurring contributes to moderate right and mild left foraminal narrowing.   C7-T1: Mild facet hypertrophy is  present bilaterally. No significant disc protrusion or stenosis is present.   IMPRESSION: 1. Heterogeneous decreased marrow signal. Vertebral body heights are maintained. In the absence of anemia, the this raises the possibility of metastatic disease or melanoma. MRI cervical spine with contrast or whole-body bone scan could be used for further evaluation of osseous disease. 2. Mild foraminal narrowing bilaterally at C3-4 and C4-5. 3. Moderate left and mild right foraminal narrowing at C5-6. 4. Moderate right and mild left foraminal narrowing at C6-7. 5. Mild facet hypertrophy bilaterally at C7-T1 without significant disc protrusion or stenosis.   These results will be called to the ordering clinician or representative by the Radiologist Assistant, and communication documented in the PACS or [employer].     Electronically Signed   By: Marin Roberts M.D.   On: 02/18/2022 07:55   He reports that he has never smoked. He has never used smokeless tobacco. No results for input(s): HGBA1C, LABURIC in the last 8760 hours.  Objective:  VS:  HT:    WT:   BMI:     BP:134/88  HR:79bpm  TEMP: ( )  RESP:  Physical Exam Vitals and nursing note reviewed.  HENT:     Head: Normocephalic and atraumatic.     Right Ear: External ear normal.     Left Ear: External ear normal.     Nose: Nose normal.     Mouth/Throat:     Mouth: Mucous membranes are  moist.  Eyes:     Extraocular Movements: Extraocular movements intact.  Cardiovascular:     Rate and Rhythm: Normal rate.     Pulses: Normal pulses.  Pulmonary:     Effort: Pulmonary effort is normal.  Abdominal:     General: Abdomen is flat. There is no distension.  Musculoskeletal:        General: Tenderness present.     Cervical back: Tenderness present.     Comments: Discomfort noted with flexion, extension and side-to-side rotation. Patient has good strength in the upper extremities including 5 out of 5 strength in wrist  extension, long finger flexion and APB. There is no atrophy of the hands intrinsically. Sensation intact bilaterally. Negative Hoffman's sign.    Skin:    General: Skin is warm and dry.     Capillary Refill: Capillary refill takes less than 2 seconds.  Neurological:     General: No focal deficit present.     Mental Status: He is alert and oriented to person, place, and time.  Psychiatric:        Mood and Affect: Mood normal.        Behavior: Behavior normal.    Ortho Exam  Imaging: No results found.  Past Medical/Family/Surgical/Social History: Medications & Allergies reviewed per EMR, new medications updated. Patient Active Problem List   Diagnosis Date Noted   Personal history of colonic polyps 06/29/2014   ED (erectile dysfunction) of non-organic origin 06/29/2014   BPH (benign prostatic hyperplasia) 03/24/2013   Hyperlipidemia with target LDL less than 130 03/24/2013   History of herpes genitalis 03/24/2013   Past Medical History:  Diagnosis Date   Abdominal pain    Dyslipidemia    ED (erectile dysfunction)    Hemorrhoids    Herpes genitalis in men    Hiatal hernia    Hyperlipidemia    borderline per patient   Rectal pain    Family History  Problem Relation Age of Onset   Hypertension Father        low blood pressure   Heart disease Father 73       Arrhythmia   Cancer Paternal Aunt    Emphysema Paternal Grandmother    Cancer Maternal Grandmother        ovarian   Past Surgical History:  Procedure Laterality Date   RETINAL DETACHMENT SURGERY  1989   SHOULDER OPEN ROTATOR CUFF REPAIR  1995   SKIN BIOPSY Left 10/13/2019   left clavicle pigmented seborrheic keratosis   SKIN BIOPSY Right 10/13/2019   right miid chest  pigmented seborrheic keratosis   TONSILLECTOMY  1962   Social History   Occupational History   Not on file  Tobacco Use   Smoking status: Never   Smokeless tobacco: Never  Substance and Sexual Activity   Alcohol use: Yes    Alcohol/week:  2.0 standard drinks    Types: 1 Cans of beer, 1 Shots of liquor per week    Comment: 2 per month   Drug use: No   Sexual activity: Yes

## 2022-02-28 ENCOUNTER — Other Ambulatory Visit: Payer: Self-pay | Admitting: Physical Medicine and Rehabilitation

## 2022-03-09 ENCOUNTER — Telehealth: Payer: Self-pay | Admitting: Physical Medicine and Rehabilitation

## 2022-03-09 ENCOUNTER — Other Ambulatory Visit: Payer: Self-pay | Admitting: Physical Medicine and Rehabilitation

## 2022-03-09 MED ORDER — DULOXETINE HCL 30 MG PO CPEP: ORAL_CAPSULE | ORAL | 0 refills | Status: DC

## 2022-03-09 NOTE — Telephone Encounter (Signed)
Pt called requesting a refill of duloxetine. Please send to pharmacy on file. Pt phone number is [phone]. ?

## 2022-03-11 ENCOUNTER — Other Ambulatory Visit: Payer: Self-pay

## 2022-03-11 ENCOUNTER — Ambulatory Visit
Admission: RE | Admit: 2022-03-11 | Discharge: 2022-03-11 | Disposition: A | Discharge status: Home / Self Care | Payer: Medicare Other | Source: Ambulatory Visit | Attending: Physical Medicine and Rehabilitation | Admitting: Physical Medicine and Rehabilitation

## 2022-03-11 DIAGNOSIS — M50222 Other cervical disc displacement at C5-C6 level: Secondary | ICD-10-CM | POA: Diagnosis not present

## 2022-03-11 DIAGNOSIS — M542 Cervicalgia: Secondary | ICD-10-CM

## 2022-03-11 DIAGNOSIS — M50221 Other cervical disc displacement at C4-C5 level: Secondary | ICD-10-CM | POA: Diagnosis not present

## 2022-03-11 DIAGNOSIS — M5412 Radiculopathy, cervical region: Secondary | ICD-10-CM

## 2022-03-11 IMAGING — MR MR CERVICAL SPINE WO/W CM
5 of 8 series · 26 of 48 positions shown · IV contrast (Multihance 20cc)
Comparison: MRI cervical spine [DATE]

CLINICAL DATA: Evaluate heterogeneous marrow seen on prior
examination. Worsening head and neck pain for 6 months.

EXAM:
MRI CERVICAL SPINE WITHOUT AND WITH CONTRAST
TECHNIQUE: Multiplanar and multiecho pulse sequences of the cervical spine, to
include the craniocervical junction and cervicothoracic junction,
were obtained without and with intravenous contrast.
CONTRAST:  20mL MULTIHANCE GADOBENATE DIMEGLUMINE 529 MG/ML IV SOLN

[Series 5: T1 · sagittal · 3.0mm · 0.66mm/px · 3 of 15 slices shown (1 of 3)]
[im 1/15]
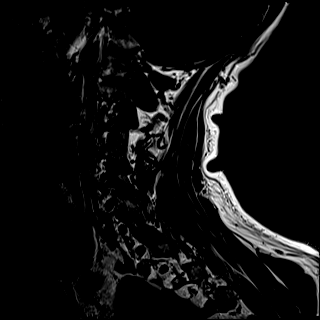
[im 8/15]
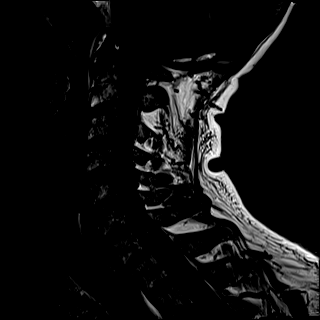
[im 15/15]
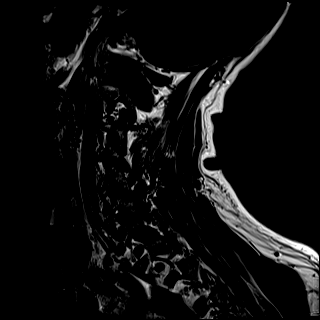

[Series 7: T2 · axial · 3.0mm · 0.50mm/px · z∈[-31,+87]mm · 9 of 37 slices shown]
[im 1/37]
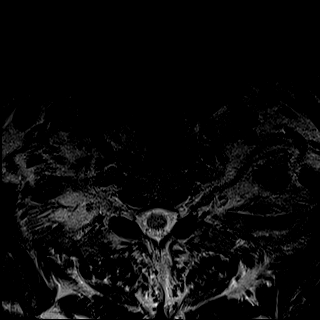
[im 5/37]
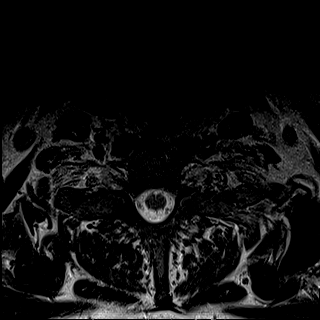
[im 10/37]
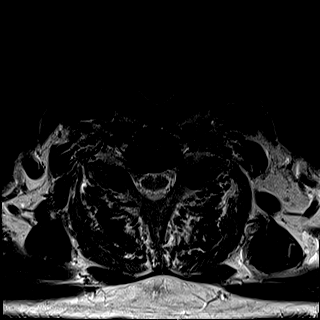
[im 14/37]
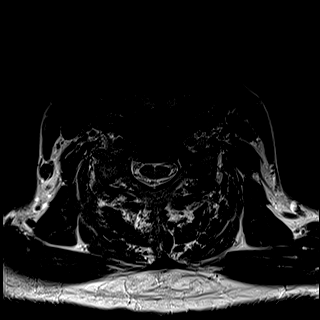
[im 19/37]
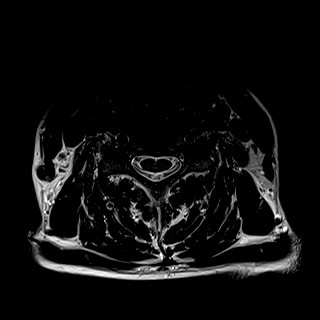
[im 23/37]
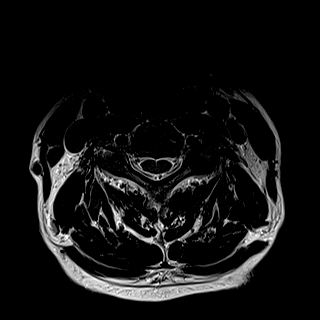
[im 28/37]
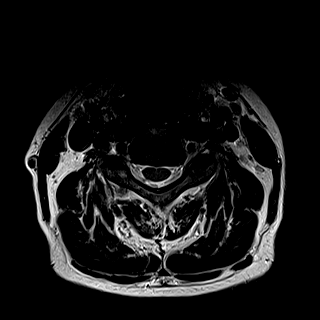
[im 32/37]
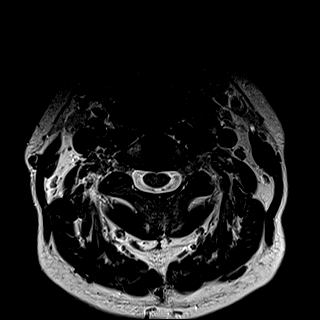
[im 37/37]
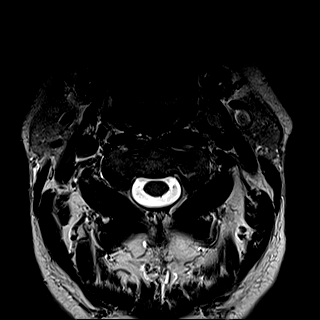

[Series 9: T1 · axial · non-contrast · 3.0mm · 0.31mm/px · z∈[-31,+87]mm · 9 of 37 slices shown (2 of 3)]
[im 1/37]
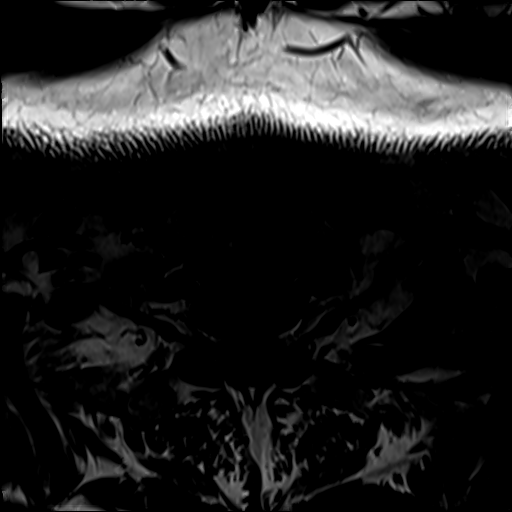
[im 5/37]
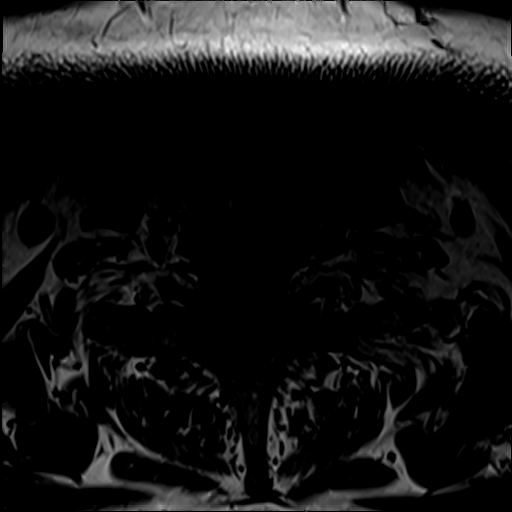
[im 10/37]
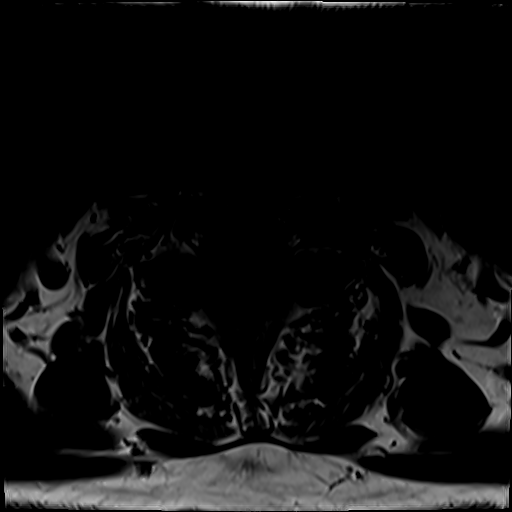
[im 14/37]
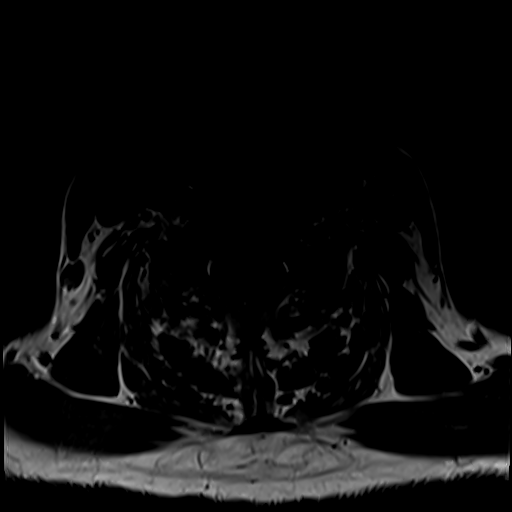
[im 19/37]
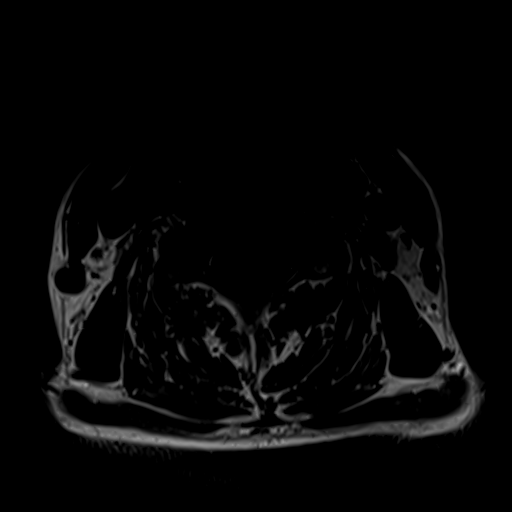
[im 23/37]
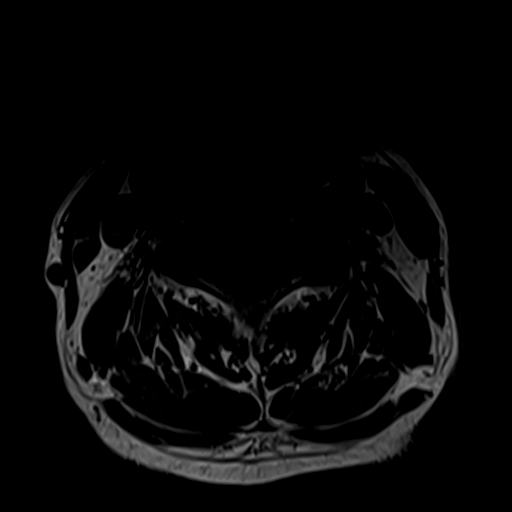
[im 28/37]
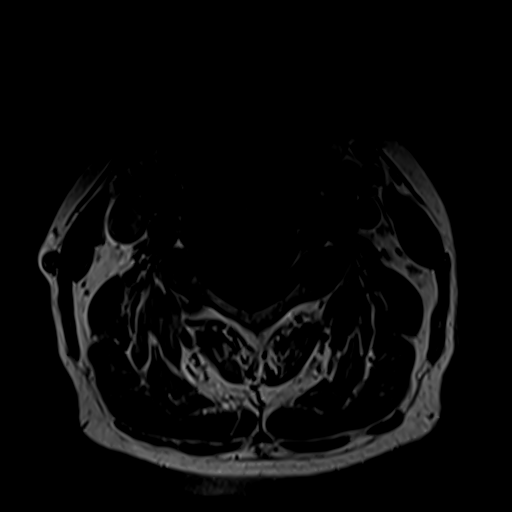
[im 32/37]
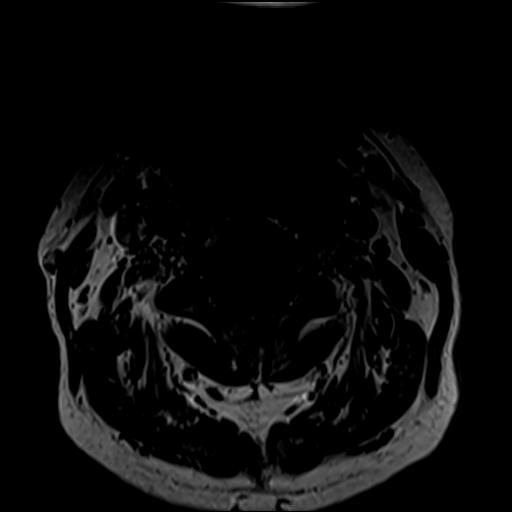
[im 37/37]
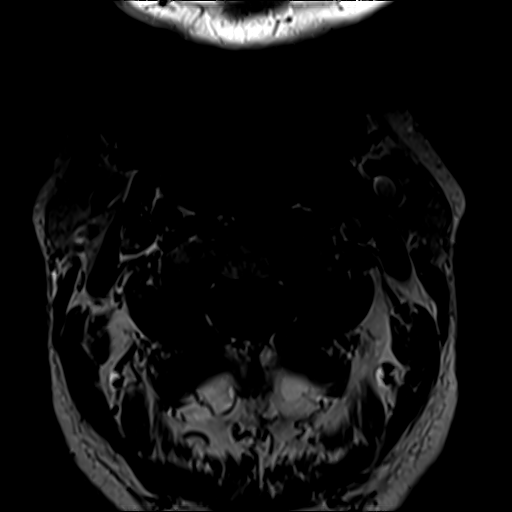

[Series 10: T2 post-contrast · sagittal · 3.0mm · 0.55mm/px · 3 of 15 slices shown]
[im 1/15]
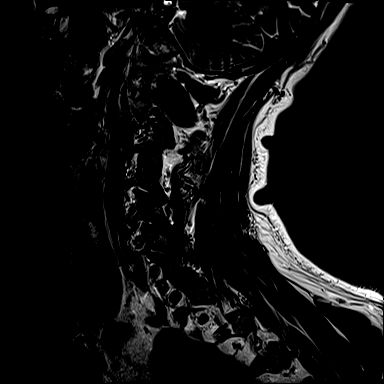
[im 8/15]
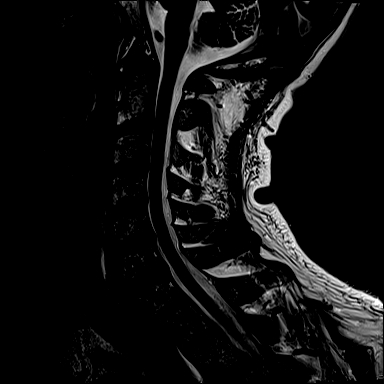
[im 15/15]
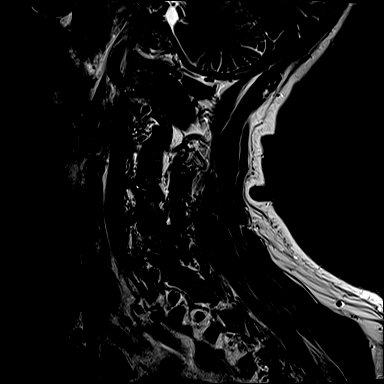

[Series 12: T1 · axial · 3.0mm · 0.31mm/px · z∈[-31,-18]mm · 2 of 37 slices shown (3 of 3)]
[im 1/37]
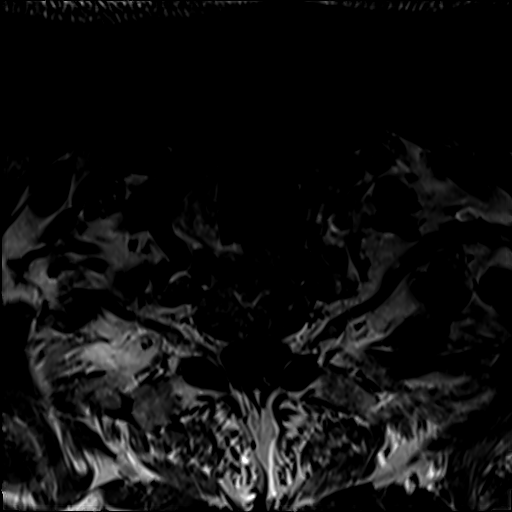
[im 5/37]
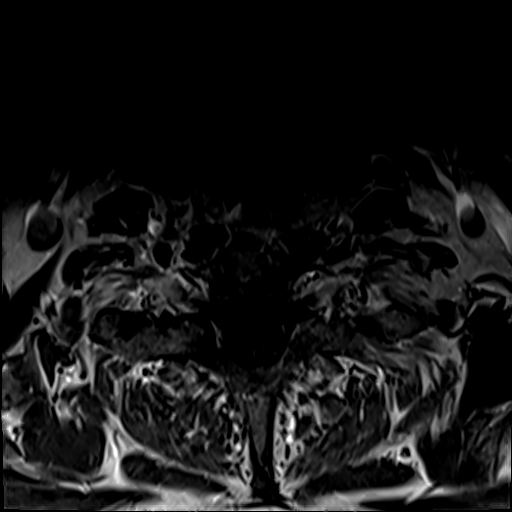

[26 of 48 positions shown; findings below may reference images not displayed]

FINDINGS: Alignment: 1 mm retrolisthesis of C3 on C4.

Vertebrae: No acute fracture, evidence of discitis, or aggressive
bone lesion. Marrow heterogeneity is present. Although this can be
caused by marrow infiltrative processes, the most common causes
include anemia, smoking, obesity, or advancing age.

Cord: Normal signal and morphology.

Posterior Fossa, vertebral arteries, paraspinal tissues: Posterior
fossa demonstrates no focal abnormality. Vertebral artery flow voids
are maintained. Paraspinal soft tissues are unremarkable.

Disc levels:

Discs: Disc desiccation throughout the cervical spine. Disc spaces
are relatively well maintained.

C2-3: No significant disc bulge. No neural foraminal stenosis. No
central canal stenosis.

C3-4: Mild broad-based disc bulge. Left uncovertebral degenerative
changes. Mild bilateral facet arthropathy. Mild bilateral foraminal
stenosis. No spinal stenosis.

C4-5: Mild broad-based disc bulge. Mild bilateral facet arthropathy.
No foraminal or central canal stenosis.

C5-6: Mild broad-based disc bulge with a small left paracentral disc
protrusion. Mild bilateral facet arthropathy. Moderate left
foraminal stenosis. No significant right foraminal stenosis. No
spinal stenosis.

C6-7: Mild broad-based disc bulge with a small right paracentral
disc protrusion. Right uncovertebral degenerative changes.
Mild-moderate right foraminal stenosis. No left foraminal stenosis.
No spinal stenosis. Mild bilateral facet arthropathy.

C7-T1: No significant disc bulge. No neural foraminal stenosis. No
central canal stenosis. Moderate bilateral facet arthropathy.
IMPRESSION: 1. Marrow heterogeneity is present. Although this can be caused by
marrow infiltrative processes, the most common causes include
anemia, smoking, obesity, or advancing age. No areas of abnormal
enhancement or aggressive bone lesions.
2. Cervical spine spondylosis as described above.
3. No acute osseous injury of the cervical spine.

## 2022-03-11 MED ORDER — GADOBENATE DIMEGLUMINE 529 MG/ML IV SOLN
20.0000 mL | Freq: Once | INTRAVENOUS | Status: AC | PRN
  Administered 2022-03-11: 20 mL via INTRAVENOUS

## 2022-03-14 ENCOUNTER — Telehealth: Payer: Self-pay | Admitting: Physical Medicine and Rehabilitation

## 2022-03-14 NOTE — Telephone Encounter (Signed)
Called patient this morning and discussed results of new cervical MRI with him. No new concerning findings noted. Patient will follow up with [place] after ENT appointment in April.  ?

## 2022-03-15 ENCOUNTER — Other Ambulatory Visit: Payer: Self-pay

## 2022-03-15 ENCOUNTER — Ambulatory Visit: Payer: Medicare Other

## 2022-03-20 ENCOUNTER — Encounter: Payer: Self-pay | Admitting: Family Medicine

## 2022-03-20 ENCOUNTER — Telehealth (INDEPENDENT_AMBULATORY_CARE_PROVIDER_SITE_OTHER): Payer: Medicare Other | Admitting: Family Medicine

## 2022-03-20 VITALS — Temp 98.1°F | Ht 72.0 in | Wt 220.0 lb

## 2022-03-20 DIAGNOSIS — J0110 Acute frontal sinusitis, unspecified: Secondary | ICD-10-CM | POA: Diagnosis not present

## 2022-03-20 MED ORDER — CLARITHROMYCIN 500 MG PO TABS: 500.0000 mg | ORAL_TABLET | Freq: Two times a day (BID) | ORAL | 0 refills | Status: DC

## 2022-03-20 NOTE — Progress Notes (Signed)
? ?  Subjective:  ? ? Patient ID: Jason Ibarra, male    DOB: [birth year], 66 y.o.   MRN: [id number] ? ?HPI ?Documentation for virtual audio and video telecommunications through [facility] encounter: ?The patient was located at home. 2 patient identifiers used.  ?The provider was located in the office. ?The patient did consent to this visit and is aware of possible charges through their insurance for this visit. ?The other persons participating in this telemedicine service were none. ?Time spent on call was 5 minutes and in review of previous records >19 minutes total for counseling and coordination of care. ?This virtual service is not related to other E/M service within previous 7 days.  ?He complains of an 8-day history of started with headache, malaise, nasal congestion, rhinorrhea with postnasal drainage, frontal sinus pressure with coughing that gets worse at night.  No fever, chills, earache.  No upper tooth discomfort. ? ?Review of Systems ? ?   ?Objective:  ? Physical Exam ?Alert and in no distress but does have slight fatigue look on his face. ? ? ? ?   ?Assessment & Plan:  ?Acute non-recurrent frontal sinusitis - Plan: clarithromycin (BIAXIN) 500 MG tablet ?There is a questionable reaction to penicillin when he was a child and I will therefore use Biaxin.  He is to call if not entirely better when he finishes. ? ?

## 2022-03-29 ENCOUNTER — Ambulatory Visit (INDEPENDENT_AMBULATORY_CARE_PROVIDER_SITE_OTHER): Payer: Medicare Other

## 2022-03-29 DIAGNOSIS — Z23 Encounter for immunization: Secondary | ICD-10-CM

## 2022-04-07 ENCOUNTER — Encounter: Payer: Self-pay | Admitting: Family Medicine

## 2022-04-07 ENCOUNTER — Ambulatory Visit (INDEPENDENT_AMBULATORY_CARE_PROVIDER_SITE_OTHER): Payer: Medicare Other | Admitting: Family Medicine

## 2022-04-07 VITALS — BP 112/68 | HR 62 | Temp 96.8°F | Wt 218.6 lb

## 2022-04-07 DIAGNOSIS — R1013 Epigastric pain: Secondary | ICD-10-CM | POA: Diagnosis not present

## 2022-04-07 NOTE — Progress Notes (Addendum)
? ?  Subjective:  ? ? Patient ID: Jason Ibarra, male    DOB: 08-27-1956, 66 y.o.   MRN: [id number] ? ?HPI ?He is here for evaluation of difficulty with abdominal pain that he describes in the upper abdominal area bilaterally.  He also has had some fever and chills with this.  He sometimes notes reflux symptoms with these with a lot of acid and burning.  He has used OTC meds to help with this but with not much success.  He cannot relate this to any particular foods. ? ? ?Review of Systems ? ?   ?Objective:  ? Physical Exam ?Alert and in no distress.  Cardiac and lung exam normal.  Abdominal exam shows some slight upper abdominal discomfort.  Murphy sign and Murphy's punch was equivocal. ? ? ? ?   ?Assessment & Plan:  ?Epigastric pain - Plan: US Abdomen Limited RUQ (LIVER/GB) ? ? ?

## 2022-04-10 ENCOUNTER — Ambulatory Visit
Admission: RE | Admit: 2022-04-10 | Discharge: 2022-04-10 | Disposition: A | Discharge status: Home / Self Care | Payer: Medicare Other | Source: Ambulatory Visit | Attending: Family Medicine | Admitting: Family Medicine

## 2022-04-10 DIAGNOSIS — R1013 Epigastric pain: Secondary | ICD-10-CM | POA: Diagnosis not present

## 2022-04-12 ENCOUNTER — Other Ambulatory Visit: Payer: Self-pay

## 2022-04-12 DIAGNOSIS — R1013 Epigastric pain: Secondary | ICD-10-CM

## 2022-04-19 DIAGNOSIS — H903 Sensorineural hearing loss, bilateral: Secondary | ICD-10-CM | POA: Diagnosis not present

## 2022-04-19 DIAGNOSIS — H9311 Tinnitus, right ear: Secondary | ICD-10-CM | POA: Diagnosis not present

## 2022-04-19 DIAGNOSIS — M542 Cervicalgia: Secondary | ICD-10-CM | POA: Diagnosis not present

## 2022-04-29 ENCOUNTER — Other Ambulatory Visit: Payer: Self-pay | Admitting: Physical Medicine and Rehabilitation

## 2022-05-04 ENCOUNTER — Encounter (HOSPITAL_COMMUNITY)
Admission: RE | Admit: 2022-05-04 | Discharge: 2022-05-04 | Disposition: A | Discharge status: Home / Self Care | Payer: Medicare Other | Source: Ambulatory Visit | Attending: Family Medicine | Admitting: Family Medicine

## 2022-05-04 DIAGNOSIS — R1013 Epigastric pain: Secondary | ICD-10-CM | POA: Diagnosis not present

## 2022-05-04 IMAGING — NM NM HEPATO W/GB/PHARM/[PERSON_NAME]
2 series · 12 of 12 positions shown · non-contrast
Comparison: None Available.

CLINICAL DATA: Patient has had 2 episodes of severe epigastric
pain, most recently 3 weeks ago

EXAM:
NUCLEAR MEDICINE HEPATOBILIARY IMAGING WITH GALLBLADDER EF
TECHNIQUE: Sequential images of the abdomen were obtained [DATE] minutes
following intravenous administration of radiopharmaceutical. After
oral ingestion of Ensure, gallbladder ejection fraction was
determined. At 60 min, normal ejection fraction is greater than 33%.
RADIOPHARMACEUTICALS:  5.3 mCi [Q1]  Choletec IV

[Series 1: biliary · 4.14mm/px · 6 of 60 frames shown]
[frame 6/60]
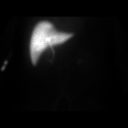
[frame 16/60]
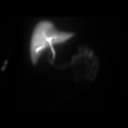
[frame 26/60]
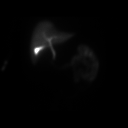
[frame 36/60]
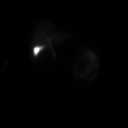
[frame 46/60]
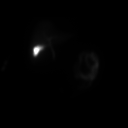
[frame 56/60]
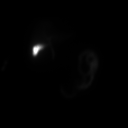

[Series 2: gbef · 4.14mm/px · 6 of 60 frames shown]
[frame 6/60]
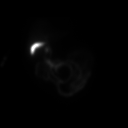
[frame 16/60]
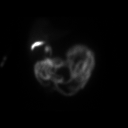
[frame 26/60]
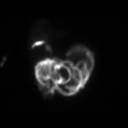
[frame 36/60]
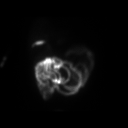
[frame 46/60]
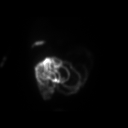
[frame 56/60]
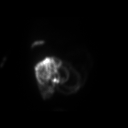

[12 of 12 positions shown; findings below may reference images not displayed]

FINDINGS: Normal tracer extraction from bloodstream indicating normal
hepatocellular function.

Normal excretion of tracer into biliary tree.

Gallbladder visualized at 18 min.

Small bowel visualized at 13 min.

No hepatic retention of tracer.

Subjectively normal emptying of tracer from gallbladder following
fatty meal stimulation.

Calculated gallbladder ejection fraction is 92%, normal.

Patient reported no symptoms following Ensure ingestion.

Normal gallbladder ejection fraction following Ensure ingestion is
greater than 33% at 1 hour.
IMPRESSION: Normal exam.

## 2022-05-04 MED ORDER — TECHNETIUM TC 99M MEBROFENIN IV KIT
5.1500 | PACK | Freq: Once | INTRAVENOUS | Status: AC | PRN
  Administered 2022-05-04: 5.15 via INTRAVENOUS

## 2022-05-17 ENCOUNTER — Ambulatory Visit: Payer: Medicare Other | Admitting: Family Medicine

## 2022-05-26 DIAGNOSIS — M542 Cervicalgia: Secondary | ICD-10-CM | POA: Diagnosis not present

## 2022-06-07 DIAGNOSIS — M542 Cervicalgia: Secondary | ICD-10-CM | POA: Diagnosis not present

## 2022-06-23 DIAGNOSIS — M2420 Disorder of ligament, unspecified site: Secondary | ICD-10-CM | POA: Diagnosis not present

## 2022-06-28 DIAGNOSIS — M2420 Disorder of ligament, unspecified site: Secondary | ICD-10-CM

## 2022-06-28 HISTORY — DX: Disorder of ligament, unspecified site: M24.20

## 2022-08-30 ENCOUNTER — Encounter: Payer: Self-pay | Admitting: Internal Medicine

## 2022-09-13 ENCOUNTER — Telehealth: Payer: Self-pay | Admitting: Family Medicine

## 2022-09-13 NOTE — Telephone Encounter (Signed)
Left message for patient to call back and schedule Medicare Annual Wellness Visit (AWV) either virtually or in office. I left my number for patient to call [phone].  I wanted to see if patient could do AWV prior to appt with PCP  Last WTM 09/20/21 please schedule at anytime with health coach  This should be a 45 minute visit.

## 2022-09-21 DIAGNOSIS — M2420 Disorder of ligament, unspecified site: Secondary | ICD-10-CM | POA: Diagnosis not present

## 2022-09-22 ENCOUNTER — Ambulatory Visit (INDEPENDENT_AMBULATORY_CARE_PROVIDER_SITE_OTHER): Payer: Medicare Other

## 2022-09-22 VITALS — Ht 72.0 in | Wt 220.0 lb

## 2022-09-22 DIAGNOSIS — Z0000 Encounter for general adult medical examination without abnormal findings: Secondary | ICD-10-CM | POA: Diagnosis not present

## 2022-09-22 NOTE — Progress Notes (Signed)
I connected with Granville Lewis today by telephone and verified that I am speaking with the correct person using two identifiers. Location patient: home Location provider: work Persons participating in the virtual visit: Granville Lewis, Glenna Durand LPN.   I discussed the limitations, risks, security and privacy concerns of performing an evaluation and management service by telephone and the availability of in person appointments. I also discussed with the patient that there may be a patient responsible charge related to this service. The patient expressed understanding and verbally consented to this telephonic visit.    Interactive audio and video telecommunications were attempted between this provider and patient, however failed, due to patient having technical difficulties OR patient did not have access to video capability.  We continued and completed visit with audio only.     Vital signs may be patient reported or missing.  Subjective:   CIRO TASHIRO is a 66 y.o. male who presents for an Initial Medicare Annual Wellness Visit.  Review of Systems     Cardiac Risk Factors include: advanced age (>5mn, >>58women);dyslipidemia;male gender     Objective:    Today's Vitals   09/22/22 1542  Weight: 220 lb (99.8 kg)  Height: 6' (1.829 m)  PainSc: 1    Body mass index is 29.84 kg/m.     09/22/2022    3:48 PM 12/07/2021    9:37 AM 09/20/2021    9:38 AM  Advanced Directives  Does Patient Have a Medical Advance Directive? Yes Yes Yes  Type of [place] Living will;Healthcare Power of Attorney  Does patient want to make changes to medical advance directive?  No - Patient declined No - Patient declined  Copy of [place]? No - copy requested No - copy requested No - copy requested    Current Medications (verified) Outpatient Encounter Medications as of  09/22/2022  Medication Sig   albuterol (PROVENTIL HFA;VENTOLIN HFA) 108 (90 Base) MCG/ACT inhaler Inhale 2 puffs into the lungs every 6 (six) hours as needed for wheezing or shortness of breath.   atorvastatin (LIPITOR) 20 MG tablet Take 1 tablet (20 mg total) by mouth daily.   gabapentin (NEURONTIN) 100 MG capsule Take by mouth.   ibuprofen (ADVIL) 200 MG tablet Take 200 mg by mouth every 6 (six) hours as needed.   Multiple Vitamins-Minerals (MULTIVITAMIN WITH MINERALS) tablet Take 1 tablet by mouth daily.     valACYclovir (VALTREX) 500 MG tablet 1 tablet twice a day for 5 days at the onset of symptoms   vitamin C (ASCORBIC ACID) 500 MG tablet Take 500 mg by mouth daily. Reported on 02/29/2016   VITAMIN E PO Take by mouth.   clarithromycin (BIAXIN) 500 MG tablet Take 1 tablet (500 mg total) by mouth 2 (two) times daily. (Patient not taking: Reported on 04/07/2022)   No facility-administered encounter medications on file as of 09/22/2022.    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   ED (erectile dysfunction)    Hemorrhoids    Herpes genitalis in men    Hiatal hernia    Hyperlipidemia    borderline per patient   Rectal pain    Past Surgical History:  Procedure Laterality Date   RETINAL DETACHMENT [place]  SKIN BIOPSY Left 10/13/2019   left clavicle pigmented seborrheic keratosis   SKIN BIOPSY Right 10/13/2019   right miid chest  pigmented seborrheic keratosis   TONSILLECTOMY  1962   Family History  Problem Relation Age of Onset   Hypertension Father        low blood pressure   Heart disease Father 9       Arrhythmia   Cancer Paternal Aunt    Emphysema Paternal Grandmother    Cancer Maternal Grandmother        ovarian   Social History   Socioeconomic History   Marital status: Married    Spouse name:  Not on file   Number of children: Not on file   Years of education: Not on file   Highest education level: Not on file  Occupational History   Not on file  Tobacco Use   Smoking status: Never   Smokeless tobacco: Never  Vaping Use   Vaping Use: Never used  Substance and Sexual Activity   Alcohol use: Yes    Alcohol/week: 2.0 standard drinks of alcohol    Types: 1 Cans of beer, 1 Shots of liquor per week    Comment: 2 per month   Drug use: No   Sexual activity: Yes  Other Topics Concern   Not on file  Social History Narrative   Not on file   Social Determinants of Health   Financial Resource Strain: Low Risk  (09/22/2022)   Overall Financial Resource Strain (CARDIA)    Difficulty of Paying Living Expenses: Not hard at all  Food Insecurity: No Food Insecurity (09/22/2022)   Hunger Vital Sign    Worried About Running Out of Food in the Last Year: Never true    Ran Out of Food in the Last Year: Never true  Transportation Needs: No Transportation Needs (09/22/2022)   PRAPARE - Hydrologist (Medical): No    Lack of Transportation (Non-Medical): No  Physical Activity: Insufficiently Active (09/22/2022)   Exercise Vital Sign    Days of Exercise per Week: 2 days    Minutes of Exercise per Session: 60 min  Stress: No Stress Concern Present (09/22/2022)   [place]    Feeling of Stress : Not at all  Social Connections: Not on file    Tobacco Counseling Counseling given: Not Answered   Clinical Intake:  Pre-visit preparation completed: Yes  Pain : 0-10 Pain Score: 1  Pain Location: Neck Pain Orientation: Right Pain Radiating Towards: general around jaw, ear and neck Pain Descriptors / Indicators: Aching Pain Onset: More than a month ago Pain Frequency: Constant     Nutritional Status: BMI 25 -29 Overweight Nutritional Risks: None Diabetes: No  How often do you need to  have someone help you when you read instructions, pamphlets, or other written materials from your doctor or pharmacy?: 1 - Never What is the last grade level you completed in school?: college  Diabetic? no  Interpreter Needed?: No  Information entered by :: NAllen LPN   Activities of Daily Living    09/22/2022    3:49 PM 09/18/2022   10:31 AM  In your present state of health, do you have any difficulty performing the following activities:  Hearing? 0 0  Vision? 0 0  Difficulty concentrating or making decisions? 0 0  Walking or climbing stairs? 0 0  Dressing or bathing? 0 0  Doing errands, shopping? 0 0  Preparing Food and eating ? N N  Using the Toilet? N N  In the past six months, have you accidently leaked urine? N N  Do you have problems with loss of bowel control? N N  Managing your Medications? N N  Managing your Finances? N N  Housekeeping or managing your Housekeeping? N N    Patient Care Team: Denita Lung, MD as PCP - General (Family Medicine)  Indicate any recent Medical Services you may have received from other than [facility] providers in the past year (date may be approximate).     Assessment:   This is a routine wellness examination for [place].  Hearing/Vision screen Vision Screening - Comments:: Regular eye exams, Dr. Wyatt Portela  Dietary issues and exercise activities discussed: Current Exercise Habits: Home exercise routine, Type of exercise: Other - see comments (golf), Time (Minutes): 60, Frequency (Times/Week): 2, Weekly Exercise (Minutes/Week): 120   Goals Addressed             This Visit's Progress    Patient Stated       09/22/2022, continue to lose weight, play better golf       Depression Screen    09/22/2022    3:49 PM 09/20/2021    9:40 AM 10/13/2019   10:30 AM 08/05/2019    9:10 AM 07/22/2018   10:07 AM 07/04/2017   10:22 AM 07/03/2016   11:05 AM  PHQ 2/9 Scores  PHQ - 2 Score 0 0 0 0 0 0 0  PHQ- 9 Score 0          Fall  Risk    09/22/2022    3:48 PM 09/18/2022   10:31 AM 09/20/2021    9:40 AM 09/16/2020    8:16 AM 07/22/2018   10:07 AM  Fall Risk   Falls in the past year? 0 0 0 0 No  Number falls in past yr: 0 0 0    Injury with Fall? 0 0 0    Risk for fall due to : Medication side effect      Follow up Falls prevention discussed;Education provided;Falls evaluation completed  Falls evaluation completed      FALL RISK PREVENTION PERTAINING TO THE HOME:  Any stairs in or around the home? Yes  If so, are there any without handrails? No  Home free of loose throw rugs in walkways, pet beds, electrical cords, etc? Yes  Adequate lighting in your home to reduce risk of falls? Yes   ASSISTIVE DEVICES UTILIZED TO PREVENT FALLS:  Life alert? No  Use of a cane, walker or w/c? No  Grab bars in the bathroom? No  Shower chair or bench in shower? No  Elevated toilet seat or a handicapped toilet? No   TIMED UP AND GO:  Was the test performed? No .      Cognitive Function:        09/22/2022    3:50 PM  6CIT Screen  What Year? 0 points  What month? 0 points  What time? 0 points  Count back from 20 0 points  Months in reverse 0 points  Repeat phrase 0 points  Total Score 0 points    Immunizations Immunization History  Administered Date(s) Administered   DTaP 08/17/1998   Fluad Quad(high Dose 65+) 09/16/2020, 09/20/2021   Influenza Split 09/08/2011, 11/08/2012, 10/11/2015   Influenza,inj,Quad PF,6+ Mos 10/13/2019   PFIZER(Purple Top)SARS-COV-2 Vaccination 03/10/2020, 03/31/2020, 10/05/2020   Pfizer Covid-19 Vaccine Bivalent Booster 62yr & up 03/29/2022  Pneumococcal Conjugate-13 09/16/2020   Pneumococcal Polysaccharide-23 09/20/2021   Tdap 02/23/2007, 07/04/2017   Zoster Recombinat (Shingrix) 08/12/2019, 10/13/2019   Zoster, Live 07/01/2015    TDAP status: Up to date  Flu Vaccine status: Due, Education has been provided regarding the importance of this vaccine. Advised may receive this  vaccine at local pharmacy or Health Dept. Aware to provide a copy of the vaccination record if obtained from local pharmacy or Health Dept. Verbalized acceptance and understanding.  Pneumococcal vaccine status: Up to date  Covid-19 vaccine status: Completed vaccines  Qualifies for Shingles Vaccine? Yes   Zostavax completed Yes   Shingrix Completed?: Yes  Screening Tests Health Maintenance  Topic Date Due   INFLUENZA VACCINE  07/25/2022   COVID-19 Vaccine (5 - Pfizer series) 07/29/2022   TETANUS/TDAP  07/05/2027   COLONOSCOPY (Pts 45-67yr Insurance coverage will need to be confirmed)  10/19/2031   Pneumonia Vaccine 67 Years old  Completed   Hepatitis C Screening  Completed   Zoster Vaccines- Shingrix  Completed   HPV VACCINES  Aged Out    Health Maintenance  Health Maintenance Due  Topic Date Due   INFLUENZA VACCINE  07/25/2022   COVID-19 Vaccine (5 - Pfizer series) 07/29/2022    Colorectal cancer screening: Type of screening: Colonoscopy. Completed 10/18/2021. Repeat every 5 years  Lung Cancer Screening: (Low Dose CT Chest recommended if Age 67-80years, 30 pack-year currently smoking OR have quit w/in 15years.) does not qualify.   Lung Cancer Screening Referral: no  Additional Screening:  Hepatitis C Screening: does qualify; Completed 07/03/2016  Vision Screening: Recommended annual ophthalmology exams for early detection of glaucoma and other disorders of the eye. Is the patient up to date with their annual eye exam?  Yes  Who is the provider or what is the name of the office in which the patient attends annual eye exams? Dr. GKaty FitchIf pt is not established with a provider, would they like to be referred to a provider to establish care? No .   Dental Screening: Recommended annual dental exams for proper oral hygiene  Community Resource Referral / Chronic Care Management: CRR required this visit?  No   CCM required this visit?  No      Plan:     I have  personally reviewed and noted the following in the patient's chart:   Medical and social history Use of alcohol, tobacco or illicit drugs  Current medications and supplements including opioid prescriptions. Patient is not currently taking opioid prescriptions. Functional ability and status Nutritional status Physical activity Advanced directives List of other physicians Hospitalizations, surgeries, and ER visits in previous 12 months Vitals Screenings to include cognitive, depression, and falls Referrals and appointments  In addition, I have reviewed and discussed with patient certain preventive protocols, quality metrics, and best practice recommendations. A written personalized care plan for preventive services as well as general preventive health recommendations were provided to patient.     NKellie Simmering LPN   [id number]  Nurse Notes: none  Due to this being a virtual visit, the after visit summary with patients personalized plan was offered to patient via mail or my-chart.  Patient would like to access on my-chart

## 2022-09-22 NOTE — Patient Instructions (Signed)
[name] , Thank you for taking time to come for your Medicare Wellness Visit. I appreciate your ongoing commitment to your health goals. Please review the following plan we discussed and let me know if I can assist you in the future.   Screening recommendations/referrals: Colonoscopy: completed 10/18/2021, due 10/18/2026 Recommended yearly ophthalmology/optometry visit for glaucoma screening and checkup Recommended yearly dental visit for hygiene and checkup  Vaccinations: Influenza vaccine: due Pneumococcal vaccine: completed 09/20/2021 Tdap vaccine: completed 07/04/2017, due 07/05/2027 Shingles vaccine: completed   Covid-19:  03/29/2022, 10/05/2020, 03/31/2020, 03/10/2020  Advanced directives: Please bring a copy of your POA (Power of Attorney) and/or Living Will to your next appointment.   Conditions/risks identified: none  Next appointment: Follow up in one year for your annual wellness visit.   Preventive Care 75 Years and Older, Male Preventive care refers to lifestyle choices and visits with your health care provider that can promote health and wellness. What does preventive care include? A yearly physical exam. This is also called an annual well check. Dental exams once or twice a year. Routine eye exams. Ask your health care provider how often you should have your eyes checked. Personal lifestyle choices, including: Daily care of your teeth and gums. Regular physical activity. Eating a healthy diet. Avoiding tobacco and drug use. Limiting alcohol use. Practicing safe sex. Taking low doses of aspirin every day. Taking vitamin and mineral supplements as recommended by your health care provider. What happens during an annual well check? The services and screenings done by your health care provider during your annual well check will depend on your age, overall health, lifestyle risk factors, and family history of disease. Counseling  Your health care provider may ask you  questions about your: Alcohol use. Tobacco use. Drug use. Emotional well-being. Home and relationship well-being. Sexual activity. Eating habits. History of falls. Memory and ability to understand (cognition). Work and work Statistician. Screening  You may have the following tests or measurements: Height, weight, and BMI. Blood pressure. Lipid and cholesterol levels. These may be checked every 5 years, or more frequently if you are over 27 years old. Skin check. Lung cancer screening. You may have this screening every year starting at age 47 if you have a 30-pack-year history of smoking and currently smoke or have quit within the past 15 years. Fecal occult blood test (FOBT) of the stool. You may have this test every year starting at age 23. Flexible sigmoidoscopy or colonoscopy. You may have a sigmoidoscopy every 5 years or a colonoscopy every 10 years starting at age 41. Prostate cancer screening. Recommendations will vary depending on your family history and other risks. Hepatitis C blood test. Hepatitis B blood test. Sexually transmitted disease (STD) testing. Diabetes screening. This is done by checking your blood sugar (glucose) after you have not eaten for a while (fasting). You may have this done every 1-3 years. Abdominal aortic aneurysm (AAA) screening. You may need this if you are a current or former smoker. Osteoporosis. You may be screened starting at age 67 if you are at high risk. Talk with your health care provider about your test results, treatment options, and if necessary, the need for more tests. Vaccines  Your health care provider may recommend certain vaccines, such as: Influenza vaccine. This is recommended every year. Tetanus, diphtheria, and acellular pertussis (Tdap, Td) vaccine. You may need a Td booster every 10 years. Zoster vaccine. You may need this after age 58. Pneumococcal 13-valent conjugate (PCV13) vaccine. One dose  is recommended after age  65. Pneumococcal polysaccharide (PPSV23) vaccine. One dose is recommended after age 7. Talk to your health care provider about which screenings and vaccines you need and how often you need them. This information is not intended to replace advice given to you by your health care provider. Make sure you discuss any questions you have with your health care provider. Document Released: 01/07/2016 Document Revised: 08/30/2016 Document Reviewed: 10/12/2015 Elsevier Interactive Patient Education  2017 [place] Prevention in the Home Falls can cause injuries. They can happen to people of all ages. There are many things you can do to make your home safe and to help prevent falls. What can I do on the outside of my home? Regularly fix the edges of walkways and driveways and fix any cracks. Remove anything that might make you trip as you walk through a door, such as a raised step or threshold. Trim any bushes or trees on the path to your home. Use bright outdoor lighting. Clear any walking paths of anything that might make someone trip, such as rocks or tools. Regularly check to see if handrails are loose or broken. Make sure that both sides of any steps have handrails. Any raised decks and porches should have guardrails on the edges. Have any leaves, snow, or ice cleared regularly. Use sand or salt on walking paths during winter. Clean up any spills in your garage right away. This includes oil or grease spills. What can I do in the bathroom? Use night lights. Install grab bars by the toilet and in the tub and shower. Do not use towel bars as grab bars. Use non-skid mats or decals in the tub or shower. If you need to sit down in the shower, use a plastic, non-slip stool. Keep the floor dry. Clean up any water that spills on the floor as soon as it happens. Remove soap buildup in the tub or shower regularly. Attach bath mats securely with double-sided non-slip rug tape. Do not have throw  rugs and other things on the floor that can make you trip. What can I do in the bedroom? Use night lights. Make sure that you have a light by your bed that is easy to reach. Do not use any sheets or blankets that are too big for your bed. They should not hang down onto the floor. Have a firm chair that has side arms. You can use this for support while you get dressed. Do not have throw rugs and other things on the floor that can make you trip. What can I do in the kitchen? Clean up any spills right away. Avoid walking on wet floors. Keep items that you use a lot in easy-to-reach places. If you need to reach something above you, use a strong step stool that has a grab bar. Keep electrical cords out of the way. Do not use floor polish or wax that makes floors slippery. If you must use wax, use non-skid floor wax. Do not have throw rugs and other things on the floor that can make you trip. What can I do with my stairs? Do not leave any items on the stairs. Make sure that there are handrails on both sides of the stairs and use them. Fix handrails that are broken or loose. Make sure that handrails are as long as the stairways. Check any carpeting to make sure that it is firmly attached to the stairs. Fix any carpet that is loose or worn. Avoid  having throw rugs at the top or bottom of the stairs. If you do have throw rugs, attach them to the floor with carpet tape. Make sure that you have a light switch at the top of the stairs and the bottom of the stairs. If you do not have them, ask someone to add them for you. What else can I do to help prevent falls? Wear shoes that: Do not have high heels. Have rubber bottoms. Are comfortable and fit you well. Are closed at the toe. Do not wear sandals. If you use a stepladder: Make sure that it is fully opened. Do not climb a closed stepladder. Make sure that both sides of the stepladder are locked into place. Ask someone to hold it for you, if  possible. Clearly mark and make sure that you can see: Any grab bars or handrails. First and last steps. Where the edge of each step is. Use tools that help you move around (mobility aids) if they are needed. These include: Canes. Walkers. Scooters. Crutches. Turn on the lights when you go into a dark area. Replace any light bulbs as soon as they burn out. Set up your furniture so you have a clear path. Avoid moving your furniture around. If any of your floors are uneven, fix them. If there are any pets around you, be aware of where they are. Review your medicines with your doctor. Some medicines can make you feel dizzy. This can increase your chance of falling. Ask your doctor what other things that you can do to help prevent falls. This information is not intended to replace advice given to you by your health care provider. Make sure you discuss any questions you have with your health care provider. Document Released: 10/07/2009 Document Revised: 05/18/2016 Document Reviewed: 01/15/2015 Elsevier Interactive Patient Education  2017 [employer].

## 2022-09-25 ENCOUNTER — Ambulatory Visit (INDEPENDENT_AMBULATORY_CARE_PROVIDER_SITE_OTHER): Payer: Medicare Other | Admitting: Family Medicine

## 2022-09-25 VITALS — BP 108/66 | HR 53 | Temp 96.8°F | Ht 72.0 in | Wt 220.0 lb

## 2022-09-25 DIAGNOSIS — E785 Hyperlipidemia, unspecified: Secondary | ICD-10-CM

## 2022-09-25 DIAGNOSIS — N400 Enlarged prostate without lower urinary tract symptoms: Secondary | ICD-10-CM

## 2022-09-25 DIAGNOSIS — F5221 Male erectile disorder: Secondary | ICD-10-CM

## 2022-09-25 DIAGNOSIS — Z86010 Personal history of colonic polyps: Secondary | ICD-10-CM | POA: Diagnosis not present

## 2022-09-25 DIAGNOSIS — Z23 Encounter for immunization: Secondary | ICD-10-CM

## 2022-09-25 DIAGNOSIS — Z0000 Encounter for general adult medical examination without abnormal findings: Secondary | ICD-10-CM

## 2022-09-25 MED ORDER — FINASTERIDE 5 MG PO TABS: 5.0000 mg | ORAL_TABLET | Freq: Every day | ORAL | 3 refills | Status: DC

## 2022-09-25 MED ORDER — ATORVASTATIN CALCIUM 20 MG PO TABS: 20.0000 mg | ORAL_TABLET | Freq: Every day | ORAL | 3 refills | Status: DC

## 2022-09-25 NOTE — Patient Instructions (Signed)
Health Maintenance, Male Adopting a healthy lifestyle and getting preventive care are important in promoting health and wellness. Ask your health care provider about: The right schedule for you to have regular tests and exams. Things you can do on your own to prevent diseases and keep yourself healthy. What should I know about diet, weight, and exercise? Eat a healthy diet  Eat a diet that includes plenty of vegetables, fruits, low-fat dairy products, and lean protein. Do not eat a lot of foods that are high in solid fats, added sugars, or sodium. Maintain a healthy weight Body mass index (BMI) is a measurement that can be used to identify possible weight problems. It estimates body fat based on height and weight. Your health care provider can help determine your BMI and help you achieve or maintain a healthy weight. Get regular exercise Get regular exercise. This is one of the most important things you can do for your health. Most adults should: Exercise for at least 150 minutes each week. The exercise should increase your heart rate and make you sweat (moderate-intensity exercise). Do strengthening exercises at least twice a week. This is in addition to the moderate-intensity exercise. Spend less time sitting. Even light physical activity can be beneficial. Watch cholesterol and blood lipids Have your blood tested for lipids and cholesterol at 67 years of age, then have this test every 5 years. You may need to have your cholesterol levels checked more often if: Your lipid or cholesterol levels are high. You are older than 67 years of age. You are at high risk for heart disease. What should I know about cancer screening? Many types of cancers can be detected early and may often be prevented. Depending on your health history and family history, you may need to have cancer screening at various ages. This may include screening for: Colorectal cancer. Prostate cancer. Skin cancer. Lung  cancer. What should I know about heart disease, diabetes, and high blood pressure? Blood pressure and heart disease High blood pressure causes heart disease and increases the risk of stroke. This is more likely to develop in people who have high blood pressure readings or are overweight. Talk with your health care provider about your target blood pressure readings. Have your blood pressure checked: Every 3-5 years if you are 18-39 years of age. Every year if you are 40 years old or older. If you are between the ages of 65 and 75 and are a current or former smoker, ask your health care provider if you should have a one-time screening for abdominal aortic aneurysm (AAA). Diabetes Have regular diabetes screenings. This checks your fasting blood sugar level. Have the screening done: Once every three years after age 45 if you are at a normal weight and have a low risk for diabetes. More often and at a younger age if you are overweight or have a high risk for diabetes. What should I know about preventing infection? Hepatitis B If you have a higher risk for hepatitis B, you should be screened for this virus. Talk with your health care provider to find out if you are at risk for hepatitis B infection. Hepatitis C Blood testing is recommended for: Everyone born from 1945 through 1965. Anyone with known risk factors for hepatitis C. Sexually transmitted infections (STIs) You should be screened each year for STIs, including gonorrhea and chlamydia, if: You are sexually active and are younger than 67 years of age. You are older than 67 years of age and your   health care provider tells you that you are at risk for this type of infection. Your sexual activity has changed since you were last screened, and you are at increased risk for chlamydia or gonorrhea. Ask your health care provider if you are at risk. Ask your health care provider about whether you are at high risk for HIV. Your health care provider  may recommend a prescription medicine to help prevent HIV infection. If you choose to take medicine to prevent HIV, you should first get tested for HIV. You should then be tested every 3 months for as long as you are taking the medicine. Follow these instructions at home: Alcohol use Do not drink alcohol if your health care provider tells you not to drink. If you drink alcohol: Limit how much you have to 0-2 drinks a day. Know how much alcohol is in your drink. In the [place] one drink equals one 12 oz bottle of beer (355 mL), one 5 oz glass of wine (148 mL), or one 1 oz glass of hard liquor (44 mL). Lifestyle Do not use any products that contain nicotine or tobacco. These products include cigarettes, chewing tobacco, and vaping devices, such as e-cigarettes. If you need help quitting, ask your health care provider. Do not use street drugs. Do not share needles. Ask your health care provider for help if you need support or information about quitting drugs. General instructions Schedule regular health, dental, and eye exams. Stay current with your vaccines. Tell your health care provider if: You often feel depressed. You have ever been abused or do not feel safe at home. Summary Adopting a healthy lifestyle and getting preventive care are important in promoting health and wellness. Follow your health care provider's instructions about healthy diet, exercising, and getting tested or screened for diseases. Follow your health care provider's instructions on monitoring your cholesterol and blood pressure. This information is not intended to replace advice given to you by your health care provider. Make sure you discuss any questions you have with your health care provider. Document Revised: 05/02/2021 Document Reviewed: 05/02/2021 Elsevier Patient Education  2023 Else[employer] Inc[employer]  

## 2022-09-25 NOTE — Progress Notes (Signed)
Complete physical exam  Patient: Jason Ibarra   DOB: April 10, 1956   66 y.o. Male  MRN: [id number]  Subjective:    Chief Complaint  Patient presents with   Annual Exam    Fasting     DRAYCEN LEICHTER is a 66 y.o. male who presents today for a complete physical exam. He reports consuming a general diet. The patient does not participate in regular exercise at present. He generally feels well. He reports waking up several times per night to urinate and indicates he has hesitancy as well as decreased stream.  He does complain of arthritic type symptoms in the hands as right hip area.  Especially on the right-hand side of the hip.  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  CT scan was recently done and options discussed concerning treatment.  He is using Neurontin 100 mg which does help take the edge of the pain off.  He is not considering surgery.  He does have several insect bite on his lower legs.  He is now retired and has been doing a lot of outside yard work and enjoying it.  He does keep himself engaged intellectually with the various websites.  Most recent fall risk assessment:    09/22/2022    3:48 PM  Equality in the past year? 0  Number falls in past yr: 0  Injury with Fall? 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   Personal history of colonic polyps 06/29/2014   ED (erectile dysfunction) of non-organic origin 06/29/2014   BPH (benign prostatic hyperplasia) 03/24/2013   Hyperlipidemia with target LDL less than 130 03/24/2013   History of herpes genitalis 03/24/2013   Past Medical History:  Diagnosis Date   Abdominal pain     Dyslipidemia    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   ED (erectile dysfunction)    Hemorrhoids    Herpes genitalis in men    Hiatal hernia    Hyperlipidemia    borderline per patient   Rectal pain    Past Surgical History:  Procedure Laterality Date   [place]   SKIN BIOPSY Left 10/13/2019   left clavicle pigmented seborrheic keratosis   SKIN BIOPSY Right 10/13/2019   right miid chest  pigmented seborrheic keratosis   TONSILLECTOMY  1962   Social History   Tobacco Use   Smoking status: Never   Smokeless tobacco: Never  Vaping Use   Vaping Use: Never used  Substance Use Topics   Alcohol use: Yes    Alcohol/week: 2.0 standard drinks of alcohol    Types: 1 Cans of beer, 1 Shots of liquor per week    Comment: 2 per month   Drug use: No   Family History  Problem Relation Age of Onset   Hypertension Father        low blood pressure   Heart disease Father 20       Arrhythmia   Cancer Paternal Aunt    Emphysema  Paternal Grandmother    Cancer Maternal Grandmother        ovarian   Allergies  Allergen Reactions   Penicillins Cross Reactors       Patient Care Team: Denita Lung, MD as PCP - General (Family Medicine)   Outpatient Medications Prior to Visit  Medication Sig Note   gabapentin (NEURONTIN) 100 MG capsule Take by mouth.    ibuprofen (ADVIL) 200 MG tablet Take 200 mg by mouth every 6 (six) hours as needed. 09/25/2022: Prn last dose four nights ago   Multiple Vitamins-Minerals (MULTIVITAMIN WITH MINERALS) tablet Take 1 tablet by mouth daily.      vitamin C (ASCORBIC ACID) 500 MG tablet Take 500 mg by mouth daily. Reported on 02/29/2016    VITAMIN E PO Take by mouth.    [DISCONTINUED] atorvastatin (LIPITOR) 20 MG tablet Take 1 tablet (20 mg total) by mouth daily.    albuterol (PROVENTIL HFA;VENTOLIN HFA) 108 (90 Base) MCG/ACT inhaler Inhale 2 puffs into the lungs every 6 (six) hours as needed for wheezing  or shortness of breath. (Patient not taking: Reported on 09/25/2022) 09/25/2022: Prn last dose six months ago   clarithromycin (BIAXIN) 500 MG tablet Take 1 tablet (500 mg total) by mouth 2 (two) times daily. (Patient not taking: Reported on 04/07/2022)    valACYclovir (VALTREX) 500 MG tablet 1 tablet twice a day for 5 days at the onset of symptoms (Patient not taking: Reported on 09/25/2022) 09/25/2022: Prn last dose one year ago   No facility-administered medications prior to visit.    Review of Systems  All other systems reviewed and are negative.         Objective:     BP 108/66   Pulse (!) 53   Temp (!) 96.8 F (36 C)   Ht 6' (1.829 m)   Wt 220 lb (99.8 kg)   SpO2 97%   BMI 29.84 kg/m  BP Readings from Last 3 Encounters:  09/25/22 108/66  04/07/22 112/68  02/23/22 134/88   Wt Readings from Last 3 Encounters:  09/25/22 220 lb (99.8 kg)  09/22/22 220 lb (99.8 kg)  04/07/22 218 lb 9.6 oz (99.2 kg)      Physical Exam  Alert and in no distress. Tympanic membranes and canals are normal. Pharyngeal area is normal. Neck is supple without adenopathy or thyromegaly. Cardiac exam shows a regular sinus rhythm without murmurs or gallops. Lungs are clear.   right hip does show decreased internal rotation motion.  Last CBC Lab Results  Component Value Date   WBC 7.7 09/20/2021   HGB 15.5 09/20/2021   HCT 44.7 09/20/2021   MCV 87 09/20/2021   MCH 30.1 09/20/2021   RDW 13.2 09/20/2021   PLT 371 52/84/1324   Last metabolic panel Lab Results  Component Value Date   GLUCOSE 106 (H) 09/20/2021   NA 143 09/20/2021   K 4.4 09/20/2021   CL 105 09/20/2021   CO2 24 09/20/2021   BUN 13 09/20/2021   CREATININE 1.01 09/20/2021   EGFR 82 09/20/2021   CALCIUM 8.9 09/20/2021   PROT 6.9 09/20/2021   ALBUMIN 4.1 09/20/2021   LABGLOB 2.8 09/20/2021   AGRATIO 1.5 09/20/2021   BILITOT 0.6 09/20/2021   ALKPHOS 77 09/20/2021   AST 16 09/20/2021   ALT 14 09/20/2021   Last lipids Lab  Results  Component Value Date   CHOL 185 10/24/2021   HDL 36 (L) 10/24/2021   LDLCALC 121 (H) 10/24/2021   TRIG  154 (H) 10/24/2021   CHOLHDL 5.1 (H) 10/24/2021        Assessment & Plan:    Routine general medical examination at a health care facility - Plan: CBC with Differential/Platelet, Comprehensive metabolic panel, Lipid panel  Benign prostatic hyperplasia without lower urinary tract symptoms - Plan: CBC with Differential/Platelet, Comprehensive metabolic panel, finasteride (PROSCAR) 5 MG tablet  ED (erectile dysfunction) of non-organic origin  Hyperlipidemia with target LDL less than 130 - Plan: Lipid panel, atorvastatin (LIPITOR) 20 MG tablet  Personal history of colonic polyps - Plan: CBC with Differential/Platelet, Comprehensive metabolic panel  Need for influenza vaccination - Plan: Flu Vaccine QUAD High Dose(Fluad)  Immunization History  Administered Date(s) Administered   DTaP 08/17/1998   Fluad Quad(high Dose 65+) 09/16/2020, 09/20/2021, 09/25/2022   Influenza Split 09/08/2011, 11/08/2012, 10/11/2015   Influenza,inj,Quad PF,6+ Mos 10/13/2019   PFIZER(Purple Top)SARS-COV-2 Vaccination 03/10/2020, 03/31/2020, 10/05/2020   Pfizer Covid-19 Vaccine Bivalent Booster 63yr & up 03/29/2022   Pneumococcal Conjugate-13 09/16/2020   Pneumococcal Polysaccharide-23 09/20/2021   Tdap 02/23/2007, 07/04/2017   Zoster Recombinat (Shingrix) 08/12/2019, 10/13/2019   Zoster, Live 07/01/2015    Health Maintenance  Topic Date Due   COVID-19 Vaccine (5 - Pfizer series) 07/29/2022   TETANUS/TDAP  07/05/2027   COLONOSCOPY (Pts 45-438yrInsurance coverage will need to be confirmed)  10/19/2031   Pneumonia Vaccine 67Years old  Completed   INFLUENZA VACCINE  Completed   Hepatitis C Screening  Completed   Zoster Vaccines- Shingrix  Completed   HPV VACCINES  Aged Out    Discussed health benefits of physical activity, and encouraged him to engage in regular exercise appropriate for  his age and condition.  Discussed the hip pain with him recommending Tylenol for that plus the other aches and pains that he is having.  Moving onto an NSAID on an as-needed basis.  He will continue on the gabapentin since it seems to be helping with his pain.  I will give him finasteride to help with his BPH symptoms.  He will keep me informed concerning the efficacy of that.  Problem List Items Addressed This Visit     BPH (benign prostatic hyperplasia)   Relevant Medications   finasteride (PROSCAR) 5 MG tablet   Other Relevant Orders   CBC with Differential/Platelet   Comprehensive metabolic panel   ED (erectile dysfunction) of non-organic origin   Hyperlipidemia with target LDL less than 130   Relevant Medications   atorvastatin (LIPITOR) 20 MG tablet   Other Relevant Orders   Lipid panel   Personal history of colonic polyps   Relevant Orders   CBC with Differential/Platelet   Comprehensive metabolic panel   Other Visit Diagnoses     Routine general medical examination at a health care facility    -  Primary   Relevant Orders   CBC with Differential/Platelet   Comprehensive metabolic panel   Lipid panel   Need for influenza vaccination       Relevant Orders   Flu Vaccine QUAD High Dose(Fluad) (Completed)      Return in about 1 year (around 09/26/2023) for awv/cpe .     JoJill AlexandersMD

## 2022-09-26 LAB — CBC WITH DIFFERENTIAL/PLATELET
Basophils Absolute: 0.1 10*3/uL (ref 0.0–0.2)
Basos: 1 %
EOS (ABSOLUTE): 0.2 10*3/uL (ref 0.0–0.4)
Eos: 3 %
Hematocrit: 45.4 % (ref 37.5–51.0)
Hemoglobin: 15.4 g/dL (ref 13.0–17.7)
Immature Grans (Abs): 0 10*3/uL (ref 0.0–0.1)
Immature Granulocytes: 0 %
Lymphocytes Absolute: 1.5 10*3/uL (ref 0.7–3.1)
Lymphs: 26 %
MCH: 30.3 pg (ref 26.6–33.0)
MCHC: 33.9 g/dL (ref 31.5–35.7)
MCV: 89 fL (ref 79–97)
Monocytes Absolute: 0.6 10*3/uL (ref 0.1–0.9)
Monocytes: 10 %
Neutrophils Absolute: 3.6 10*3/uL (ref 1.4–7.0)
Neutrophils: 60 %
Platelets: 307 10*3/uL (ref 150–450)
RBC: 5.09 x10E6/uL (ref 4.14–5.80)
RDW: 13.1 % (ref 11.6–15.4)
WBC: 5.9 10*3/uL (ref 3.4–10.8)

## 2022-09-26 LAB — COMPREHENSIVE METABOLIC PANEL
ALT: 20 IU/L (ref 0–44)
AST: 22 IU/L (ref 0–40)
Albumin/Globulin Ratio: 1.8 (ref 1.2–2.2)
Albumin: 4.1 g/dL (ref 3.9–4.9)
Alkaline Phosphatase: 81 IU/L (ref 44–121)
BUN/Creatinine Ratio: 11 (ref 10–24)
BUN: 11 mg/dL (ref 8–27)
Bilirubin Total: 0.7 mg/dL (ref 0.0–1.2)
CO2: 23 mmol/L (ref 20–29)
Calcium: 9 mg/dL (ref 8.6–10.2)
Chloride: 107 mmol/L — ABNORMAL HIGH (ref 96–106)
Creatinine, Ser: 0.98 mg/dL (ref 0.76–1.27)
Globulin, Total: 2.3 g/dL (ref 1.5–4.5)
Glucose: 96 mg/dL (ref 70–99)
Potassium: 4.2 mmol/L (ref 3.5–5.2)
Sodium: 144 mmol/L (ref 134–144)
Total Protein: 6.4 g/dL (ref 6.0–8.5)
eGFR: 85 mL/min/{1.73_m2} (ref >59)

## 2022-09-26 LAB — LIPID PANEL
Chol/HDL Ratio: 3.4 ratio (ref 0.0–5.0)
Cholesterol, Total: 143 mg/dL (ref 100–199)
HDL: 42 mg/dL (ref >39)
LDL Chol Calc (NIH): 82 mg/dL (ref 0–99)
Triglycerides: 102 mg/dL (ref 0–149)
VLDL Cholesterol Cal: 19 mg/dL (ref 5–40)

## 2022-10-03 ENCOUNTER — Encounter: Payer: Self-pay | Admitting: Internal Medicine

## 2022-10-03 ENCOUNTER — Encounter: Payer: Self-pay | Admitting: Family Medicine

## 2022-10-16 ENCOUNTER — Encounter: Payer: Self-pay | Admitting: Internal Medicine

## 2023-03-14 ENCOUNTER — Ambulatory Visit (INDEPENDENT_AMBULATORY_CARE_PROVIDER_SITE_OTHER): Payer: Medicare Other | Admitting: Family Medicine

## 2023-03-14 ENCOUNTER — Encounter: Payer: Self-pay | Admitting: Family Medicine

## 2023-03-14 VITALS — BP 120/70 | HR 57 | Temp 97.9°F | Resp 16 | Wt 224.0 lb

## 2023-03-14 DIAGNOSIS — M25551 Pain in right hip: Secondary | ICD-10-CM | POA: Diagnosis not present

## 2023-03-14 DIAGNOSIS — M79604 Pain in right leg: Secondary | ICD-10-CM

## 2023-03-14 DIAGNOSIS — K645 Perianal venous thrombosis: Secondary | ICD-10-CM | POA: Diagnosis not present

## 2023-03-14 NOTE — Progress Notes (Signed)
   Subjective:    Patient ID: [name], male    DOB: 1956/06/20, 67 y.o.   MRN: [id number]  HPI He states that he has a remote history of a lump in the rectal area that only occasionally causes some discomfort.  He has had difficulty with constipation.  He also complains of difficulty with right hip pain especially with internal rotation of the hip.  During the same several month timeframe he is also noted posterior right-sided thigh pain with radiation down his leg.   Review of Systems     Objective:   Physical Exam Rectal exam does show a 2 cm purpleish slightly tender lesion at the 9 o'clock position. Hip motion does show limitation with internal rotation and slight discomfort with that. DTRs are normal.  Straight leg raising is negative.  Sensation normal in the legs.       Assessment & Plan:  Thrombosed external hemorrhoid  Right hip pain  Right leg pain I explained that he did indeed have a thrombosed hemorrhoid but it has been there for 4 months and therefore think further conservative care is not unreasonable.  Recommend heat and use of MiraLAX to make sure he has softer BMs.  He is to give it another month and if no improvement, he is to return here and I will do an excision. I then discussed the right hip pain with him and his symptoms do sound like arthritis however it is not causing a great deal of difficulty and I did recommend Tylenol for that.  Discussed the possibility of x-rays and further intervention based on symptoms. I discussed the pain down the posterior aspect of his leg and explained that at this point there is no evidence of a pinched nerve although symptomatically it did sound like it could be.  Again recommended conservative care for this. He was comfortable with all the above recommendations.

## 2023-03-22 DIAGNOSIS — M2420 Disorder of ligament, unspecified site: Secondary | ICD-10-CM | POA: Diagnosis not present

## 2023-04-23 DIAGNOSIS — H40033 Anatomical narrow angle, bilateral: Secondary | ICD-10-CM | POA: Diagnosis not present

## 2023-04-23 DIAGNOSIS — H2513 Age-related nuclear cataract, bilateral: Secondary | ICD-10-CM | POA: Diagnosis not present

## 2023-04-23 DIAGNOSIS — H4423 Degenerative myopia, bilateral: Secondary | ICD-10-CM | POA: Diagnosis not present

## 2023-04-23 DIAGNOSIS — H40013 Open angle with borderline findings, low risk, bilateral: Secondary | ICD-10-CM | POA: Diagnosis not present

## 2023-04-23 DIAGNOSIS — H35411 Lattice degeneration of retina, right eye: Secondary | ICD-10-CM | POA: Diagnosis not present

## 2023-04-23 DIAGNOSIS — H43811 Vitreous degeneration, right eye: Secondary | ICD-10-CM | POA: Diagnosis not present

## 2023-09-10 ENCOUNTER — Other Ambulatory Visit: Payer: Self-pay | Admitting: *Deleted

## 2023-09-10 ENCOUNTER — Other Ambulatory Visit: Payer: Self-pay

## 2023-09-10 DIAGNOSIS — Z125 Encounter for screening for malignant neoplasm of prostate: Secondary | ICD-10-CM

## 2023-09-11 NOTE — Progress Notes (Signed)
Patient: Jason Ibarra           Date of Birth: 1956-09-21           MRN: [id number] Visit Date: 09/10/2023 PCP: Ronnald Nian, MD  Prostate Cancer Screening Date of last physical exam: 09/24/22 Date of last rectal exam:  (2019) Have you ever had any of the following?: Enlarged prostate Have you ever had or been told you have an allergy to latex products?: No Are you currently taking any natural prostate preparations?: No Are you currently experiencing any urinary symptoms?: Yes If yes, please explain::  (low stream, frequent urination)  Prostate Exam Exam not completed. PSA blood test completed only.  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   ED (erectile dysfunction)    Hemorrhoids    Herpes genitalis in men    Hiatal hernia    Hyperlipidemia    borderline per patient   Rectal pain     Family History  Problem Relation Age of Onset   Hypertension Father        low blood pressure   Heart disease Father 69       Arrhythmia   Cancer Paternal Aunt    Emphysema Paternal Grandmother    Cancer Maternal Grandmother        ovarian    Social History   Occupational History   Not on file  Tobacco Use   Smoking status: Never   Smokeless tobacco: Never  Vaping Use   Vaping status: Never Used  Substance and Sexual Activity   Alcohol use: Yes    Alcohol/week: 2.0 standard drinks of alcohol    Types: 1 Cans of beer, 1 Shots of liquor per week    Comment: 2 per month   Drug use: No   Sexual activity: Yes

## 2023-09-14 ENCOUNTER — Other Ambulatory Visit: Payer: Self-pay | Admitting: Family Medicine

## 2023-09-14 DIAGNOSIS — E785 Hyperlipidemia, unspecified: Secondary | ICD-10-CM

## 2023-09-25 ENCOUNTER — Ambulatory Visit: Payer: Medicare Other

## 2023-09-25 DIAGNOSIS — Z0000 Encounter for general adult medical examination without abnormal findings: Secondary | ICD-10-CM | POA: Diagnosis not present

## 2023-09-25 NOTE — Progress Notes (Signed)
Subjective:   Jason Ibarra is a 67 y.o. male who presents for Medicare Annual/Subsequent preventive examination.  Visit Complete: Virtual  I connected with  Mike Gip on 09/25/23 by a audio enabled telemedicine application and verified that I am speaking with the correct person using two identifiers.  Patient Location: Home  Provider Location: Office/Clinic  I discussed the limitations of evaluation and management by telemedicine. The patient expressed understanding and agreed to proceed.  Patient Medicare AWV questionnaire was completed by the patient on 09/21/2023; I have confirmed that all information answered by patient is correct and no changes since this date.  Because this visit was a virtual/telehealth visit, some criteria may be missing or patient reported. Any vitals not documented were not able to be obtained and vitals that have been documented are patient reported.   Cardiac Risk Factors include: advanced age (>54men, >95 women);dyslipidemia;male gender     Objective:    Today's Vitals   There is no height or weight on file to calculate BMI.     09/25/2023    2:44 PM 09/22/2022    3:48 PM 12/07/2021    9:37 AM 09/20/2021    9:38 AM  Advanced Directives  Does Patient Have a Medical Advance Directive? Yes Yes Yes Yes  Type of Estate agent of [place];Living will Healthcare Power of [place];Living will Healthcare Power of [place];Living will Living will;Healthcare Power of Attorney  Does patient want to make changes to medical advance directive?   No - Patient declined No - Patient declined  Copy of Healthcare Power of Attorney in Chart? No - copy requested No - copy requested No - copy requested No - copy requested    Current Medications (verified) Outpatient Encounter Medications as of 09/25/2023  Medication Sig   albuterol (PROVENTIL HFA;VENTOLIN HFA) 108 (90 Base) MCG/ACT inhaler Inhale 2 puffs into the lungs every 6 (six)  hours as needed for wheezing or shortness of breath.   atorvastatin (LIPITOR) 20 MG tablet TAKE 1 TABLET BY MOUTH EVERY DAY   gabapentin (NEURONTIN) 100 MG capsule Take by mouth.   ibuprofen (ADVIL) 200 MG tablet Take 200 mg by mouth every 6 (six) hours as needed.   Multiple Vitamins-Minerals (MULTIVITAMIN WITH MINERALS) tablet Take 1 tablet by mouth daily.     valACYclovir (VALTREX) 500 MG tablet 1 tablet twice a day for 5 days at the onset of symptoms   vitamin C (ASCORBIC ACID) 500 MG tablet Take 500 mg by mouth daily. Reported on 02/29/2016   VITAMIN E PO Take by mouth.   No facility-administered encounter medications on file as of 09/25/2023.    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   ED (erectile dysfunction)    Hemorrhoids    Herpes genitalis in men    Hiatal hernia    Hyperlipidemia    borderline per patient   Rectal pain    Past Surgical History:  Procedure Laterality Date   RETINAL DETACHMENT SURGERY  1989   SHOULDER OPEN ROTATOR CUFF REPAIR  1995   SKIN BIOPSY Left 10/13/2019   left clavicle pigmented seborrheic keratosis   SKIN BIOPSY Right 10/13/2019   right miid chest  pigmented seborrheic keratosis   TONSILLECTOMY  1962   Family History  Problem Relation Age of Onset   Hypertension Father        low blood  pressure   Heart disease Father 44       Arrhythmia   Cancer Paternal Aunt    Emphysema Paternal Grandmother    Cancer Maternal Grandmother        ovarian   Social History   Socioeconomic History   Marital status: Married    Spouse name: Not on file   Number of children: Not on file   Years of education: Not on file   Highest education level: Not on file  Occupational History   Not on file  Tobacco Use   Smoking status: Never   Smokeless tobacco: Never  Vaping Use   Vaping status: Never Used  Substance and Sexual  Activity   Alcohol use: Yes    Alcohol/week: 2.0 standard drinks of alcohol    Types: 1 Cans of beer, 1 Shots of liquor per week    Comment: 2 per month   Drug use: No   Sexual activity: Yes  Other Topics Concern   Not on file  Social History Narrative   Not on file   Social Determinants of Health   Financial Resource Strain: Low Risk  (09/21/2023)   Overall Financial Resource Strain (CARDIA)    Difficulty of Paying Living Expenses: Not hard at all  Food Insecurity: No Food Insecurity (09/21/2023)   Hunger Vital Sign    Worried About Running Out of Food in the Last Year: Never true    Ran Out of Food in the Last Year: Never true  Transportation Needs: No Transportation Needs (09/21/2023)   PRAPARE - Administrator, Civil Service (Medical): No    Lack of Transportation (Non-Medical): No  Physical Activity: Sufficiently Active (09/21/2023)   Exercise Vital Sign    Days of Exercise per Week: 3 days    Minutes of Exercise per Session: 60 min  Stress: No Stress Concern Present (09/21/2023)   [employer] of Occupational Health - Occupational Stress Questionnaire    Feeling of Stress : Not at all  Social Connections: Unknown (09/21/2023)   Social Connection and Isolation Panel [NHANES]    Frequency of Communication with Friends and Family: More than three times a week    Frequency of Social Gatherings with Friends and Family: More than three times a week    Attends Religious Services: Not on Marketing executive or Organizations: Yes    Attends Banker Meetings: 1 to 4 times per year    Marital Status: Married    Tobacco Counseling Counseling given: Not Answered   Clinical Intake:  Pre-visit preparation completed: Yes  Pain : No/denies pain     Nutritional Risks: None Diabetes: No  How often do you need to have someone help you when you read instructions, pamphlets, or other written materials from your doctor or pharmacy?: 1 -  Never  Interpreter Needed?: No  Information entered by :: NAllen LPN   Activities of Daily Living    09/21/2023    9:10 AM  In your present state of health, do you have any difficulty performing the following activities:  Hearing? 0  Vision? 0  Difficulty concentrating or making decisions? 0  Walking or climbing stairs? 0  Dressing or bathing? 0  Doing errands, shopping? 0  Preparing Food and eating ? N  Using the Toilet? N  In the past six months, have you accidently leaked urine? N  Do you have problems with loss of bowel control? N  Managing your Medications?  N  Managing your Finances? N  Housekeeping or managing your Housekeeping? N    Patient Care Team: Ronnald Nian, MD as PCP - General (Family Medicine) [facility]  Indicate any recent Medical Services you may have received from other than [facility] providers in the past year (date may be approximate).     Assessment:   This is a routine wellness examination for [place].  Hearing/Vision screen Hearing Screening - Comments:: Denies hearing issues Vision Screening - Comments:: Regular eye exams, [facility]   Goals Addressed             This Visit's Progress    Patient Stated       09/25/2023, denies goals at this time       Depression Screen    09/25/2023    2:46 PM 09/22/2022    3:49 PM 09/20/2021    9:40 AM 10/13/2019   10:30 AM 08/05/2019    9:10 AM 07/22/2018   10:07 AM 07/04/2017   10:22 AM  PHQ 2/9 Scores  PHQ - 2 Score 0 0 0 0 0 0 0  PHQ- 9 Score 0 0         Fall Risk    09/21/2023    9:10 AM 03/14/2023    9:00 AM 09/22/2022    3:48 PM 09/18/2022   10:31 AM 09/20/2021    9:40 AM  Fall Risk   Falls in the past year? 0 0 0 0 0  Number falls in past yr: 0 0 0 0 0  Injury with Fall? 0 0 0 0 0  Risk for fall due to : Medication side effect No Fall Risks Medication side effect    Follow up Falls prevention discussed;Falls evaluation completed Falls evaluation completed  Falls prevention discussed;Education provided;Falls evaluation completed  Falls evaluation completed    MEDICARE RISK AT HOME: Medicare Risk at Home Any stairs in or around the home?: Yes If so, are there any without handrails?: No Home free of loose throw rugs in walkways, pet beds, electrical cords, etc?: No Adequate lighting in your home to reduce risk of falls?: Yes Life alert?: No Use of a cane, walker or w/c?: No Grab bars in the bathroom?: No Shower chair or bench in shower?: No Elevated toilet seat or a handicapped toilet?: No  TIMED UP AND GO:  Was the test performed?  No    Cognitive Function:        09/25/2023    2:46 PM 09/22/2022    3:50 PM  6CIT Screen  What Year? 0 points 0 points  What month? 0 points 0 points  What time? 0 points 0 points  Count back from 20 0 points 0 points  Months in reverse 2 points 0 points  Repeat phrase 0 points 0 points  Total Score 2 points 0 points    Immunizations Immunization History  Administered Date(s) Administered   DTaP 08/17/1998   Fluad Quad(high Dose 65+) 09/16/2020, 09/20/2021, 09/25/2022   Influenza Split 09/08/2011, 11/08/2012, 10/11/2015   Influenza, High Dose Seasonal PF 09/16/2023   Influenza,inj,Quad PF,6+ Mos 10/13/2019   Moderna Covid-19 Fall Seasonal Vaccine 34yrs & older 09/16/2023   PFIZER(Purple Top)SARS-COV-2 Vaccination 03/10/2020, 03/31/2020, 10/05/2020   Pfizer Covid-19 Vaccine Bivalent Booster 67yrs & up 03/29/2022   Pneumococcal Conjugate-13 09/16/2020   Pneumococcal Polysaccharide-23 09/20/2021   Tdap 02/23/2007, 07/04/2017   Zoster Recombinant(Shingrix) 08/12/2019, 10/13/2019   Zoster, Live 07/01/2015    TDAP status: Up to date  Flu Vaccine status: Up to date  Pneumococcal vaccine status: Up to date  Covid-19 vaccine status: Completed vaccines  Qualifies for Shingles Vaccine? Yes   Zostavax completed Yes   Shingrix Completed?: Yes  Screening Tests Health Maintenance  Topic Date  Due   COVID-19 Vaccine (6 - 2023-24 season) 01/16/2024   Medicare Annual Wellness (AWV)  09/24/2024   DTaP/Tdap/Td (4 - Td or Tdap) 07/05/2027   Colonoscopy  10/19/2031   Pneumonia Vaccine 52+ Years old  Completed   INFLUENZA VACCINE  Completed   Hepatitis C Screening  Completed   Zoster Vaccines- Shingrix  Completed   HPV VACCINES  Aged Out    Health Maintenance  There are no preventive care reminders to display for this patient.   Colorectal cancer screening: Type of screening: Colonoscopy. Completed 10/18/2021. Repeat every 10 years  Lung Cancer Screening: (Low Dose CT Chest recommended if Age 40-80 years, 20 pack-year currently smoking OR have quit w/in 15years.) does not qualify.   Lung Cancer Screening Referral: no  Additional Screening:  Hepatitis C Screening: does qualify; Completed 07/03/2016  Vision Screening: Recommended annual ophthalmology exams for early detection of glaucoma and other disorders of the eye. Is the patient up to date with their annual eye exam?  Yes  Who is the provider or what is the name of the office in which the patient attends annual eye exams? [facility] If pt is not established with a provider, would they like to be referred to a provider to establish care? No .   Dental Screening: Recommended annual dental exams for proper oral hygiene  Diabetic Foot Exam: n/a  Community Resource Referral / Chronic Care Management: CRR required this visit?  No   CCM required this visit?  No     Plan:     I have personally reviewed and noted the following in the patient's chart:   Medical and social history Use of alcohol, tobacco or illicit drugs  Current medications and supplements including opioid prescriptions. Patient is not currently taking opioid prescriptions. Functional ability and status Nutritional status Physical activity Advanced directives List of other physicians Hospitalizations, surgeries, and ER visits in previous 12  months Vitals Screenings to include cognitive, depression, and falls Referrals and appointments  In addition, I have reviewed and discussed with patient certain preventive protocols, quality metrics, and best practice recommendations. A written personalized care plan for preventive services as well as general preventive health recommendations were provided to patient.     Barb Merino, LPN   16/12/958   After Visit Summary: (MyChart) Due to this being a telephonic visit, the after visit summary with patients personalized plan was offered to patient via MyChart   Nurse Notes: none

## 2023-09-25 NOTE — Patient Instructions (Addendum)
Jason Ibarra , Thank you for taking time to come for your Medicare Wellness Visit. I appreciate your ongoing commitment to your health goals. Please review the following plan we discussed and let me know if I can assist you in the future.   Referrals/Orders/Follow-Ups/Clinician Recommendations: none  This is a list of the screening recommended for you and due dates:  Health Maintenance  Topic Date Due   COVID-19 Vaccine (6 - 2023-24 season) 01/16/2024   Medicare Annual Wellness Visit  09/24/2024   DTaP/Tdap/Td vaccine (4 - Td or Tdap) 07/05/2027   Colon Cancer Screening  10/19/2031   Pneumonia Vaccine  Completed   Flu Shot  Completed   Hepatitis C Screening  Completed   Zoster (Shingles) Vaccine  Completed   HPV Vaccine  Aged Out    Advanced directives: (Copy Requested) Please bring a copy of your health care power of attorney and living will to the office to be added to your chart at your convenience.  Next Medicare Annual Wellness Visit scheduled for next year: Yes  insert Preventive Care attachment Insert FALL PREVENTION attachment if needed

## 2023-09-27 ENCOUNTER — Ambulatory Visit (INDEPENDENT_AMBULATORY_CARE_PROVIDER_SITE_OTHER): Payer: Medicare Other | Admitting: Family Medicine

## 2023-09-27 ENCOUNTER — Encounter: Payer: Self-pay | Admitting: Family Medicine

## 2023-09-27 ENCOUNTER — Ambulatory Visit
Admission: RE | Admit: 2023-09-27 | Discharge: 2023-09-27 | Disposition: A | Discharge status: Home / Self Care | Payer: Medicare Other | Source: Ambulatory Visit | Attending: Family Medicine | Admitting: Family Medicine

## 2023-09-27 VITALS — BP 118/80 | HR 61 | Ht 73.5 in | Wt 222.2 lb

## 2023-09-27 DIAGNOSIS — Z23 Encounter for immunization: Secondary | ICD-10-CM

## 2023-09-27 DIAGNOSIS — Z8619 Personal history of other infectious and parasitic diseases: Secondary | ICD-10-CM

## 2023-09-27 DIAGNOSIS — E785 Hyperlipidemia, unspecified: Secondary | ICD-10-CM | POA: Diagnosis not present

## 2023-09-27 DIAGNOSIS — M25551 Pain in right hip: Secondary | ICD-10-CM | POA: Diagnosis not present

## 2023-09-27 DIAGNOSIS — Z860100 Personal history of colon polyps, unspecified: Secondary | ICD-10-CM

## 2023-09-27 DIAGNOSIS — M2420 Disorder of ligament, unspecified site: Secondary | ICD-10-CM

## 2023-09-27 DIAGNOSIS — N400 Enlarged prostate without lower urinary tract symptoms: Secondary | ICD-10-CM | POA: Diagnosis not present

## 2023-09-27 DIAGNOSIS — Z0000 Encounter for general adult medical examination without abnormal findings: Secondary | ICD-10-CM | POA: Diagnosis not present

## 2023-09-27 DIAGNOSIS — F5221 Male erectile disorder: Secondary | ICD-10-CM

## 2023-09-27 DIAGNOSIS — M1611 Unilateral primary osteoarthritis, right hip: Secondary | ICD-10-CM | POA: Diagnosis not present

## 2023-09-27 MED ORDER — ATORVASTATIN CALCIUM 20 MG PO TABS: 20.0000 mg | ORAL_TABLET | Freq: Every day | ORAL | 3 refills | Status: DC

## 2023-09-27 NOTE — Progress Notes (Signed)
Complete physical exam  Patient: Jason Ibarra   DOB: 20-Nov-1956   67 y.o. Male  MRN: [id number]  Subjective:      Jason Ibarra is a 67 y.o. male who presents today for a complete physical exam. He reports consuming a general diet.  Physical activity includes golfing and yard work.  He generally feels well. He reports sleeping well. He has a long history of difficulty with hip pain back to when he played soccer in his use.  In the last several years but slowly getting worse.  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  He has a history of using Neurontin for Eagle syndrome.  He is not sure whether the Neurontin is really doing any better and is considering stopping it.  He recently had prostate cancer screening done and was told it was a normal blood study.  He does have a history of herpes genitalis but presently is on no medicines.  He has not scheduled colonoscopy is in 2032.  Most recent fall risk assessment:    09/21/2023    9:10 AM  Fall Risk   Falls in the past year? 0  Number falls in past yr: 0  Injury with Fall? 0  Risk for fall due to : Medication side effect  Follow up Falls prevention discussed;Falls evaluation completed     Most recent depression screenings:    09/25/2023    2:46 PM 09/22/2022    3:49 PM  PHQ 2/9 Scores  PHQ - 2 Score 0 0  PHQ- 9 Score 0 0    Vision:Within last year and Dental: No current dental problems and Last dental visit: September 2024    Patient Care Team: Ronnald Nian, MD as PCP - General (Family Medicine) [facility]   Outpatient Medications Prior to Visit  Medication Sig   gabapentin (NEURONTIN) 100 MG capsule Take by mouth.   ibuprofen (ADVIL) 200 MG tablet Take 200 mg by mouth every 6 (six) hours as needed.   Multiple Vitamins-Minerals (MULTIVITAMIN WITH MINERALS) tablet Take 1 tablet by mouth daily.     valACYclovir (VALTREX) 500 MG tablet 1 tablet  twice a day for 5 days at the onset of symptoms   vitamin C (ASCORBIC ACID) 500 MG tablet Take 500 mg by mouth daily. Reported on 02/29/2016   VITAMIN E PO Take by mouth.   [DISCONTINUED] atorvastatin (LIPITOR) 20 MG tablet TAKE 1 TABLET BY MOUTH EVERY DAY   albuterol (PROVENTIL HFA;VENTOLIN HFA) 108 (90 Base) MCG/ACT inhaler Inhale 2 puffs into the lungs every 6 (six) hours as needed for wheezing or shortness of breath. (Patient not taking: Reported on 09/27/2023)   No facility-administered medications prior to visit.    Review of Systems  All other systems reviewed and are negative.  Family and social history as well as health maintenance and immunizations were reviewed       Objective:       Physical Exam  Alert and in no distress. Tympanic membranes and canals are normal. Pharyngeal area is normal. Neck is supple without adenopathy or thyromegaly. Cardiac exam shows a regular sinus rhythm without murmurs or gallops. Lungs are clear to auscultation.  Exam of the right hip does show some limitation of internal rotation.  External rotation is normal.      Assessment & Plan:    Routine general medical examination at a health care facility - Plan: CBC with Differential/Platelet,  Comprehensive metabolic panel, Lipid panel  Benign prostatic hyperplasia without lower urinary tract symptoms  History of colonic polyps  History of herpes genitalis  Hyperlipidemia with target LDL less than 130 - Plan: Lipid panel, atorvastatin (LIPITOR) 20 MG tablet  Eagle's syndrome  Right hip pain - Plan: DG Hip Unilat W OR W/O Pelvis 2-3 Views Right  Immunization History  Administered Date(s) Administered   DTaP 08/17/1998   Fluad Quad(high Dose 65+) 09/16/2020, 09/20/2021, 09/25/2022   Influenza Split 09/08/2011, 11/08/2012, 10/11/2015   Influenza, High Dose Seasonal PF 09/16/2023   Influenza,inj,Quad PF,6+ Mos 10/13/2019   Moderna Covid-19 Fall Seasonal Vaccine 60yrs & older 09/16/2023    PFIZER(Purple Top)SARS-COV-2 Vaccination 03/10/2020, 03/31/2020, 10/05/2020   Pfizer Covid-19 Vaccine Bivalent Booster 81yrs & up 03/29/2022   Pneumococcal Conjugate-13 09/16/2020   Pneumococcal Polysaccharide-23 09/20/2021   Tdap 02/23/2007, 07/04/2017   Zoster Recombinant(Shingrix) 08/12/2019, 10/13/2019   Zoster, Live 07/01/2015    Health Maintenance  Topic Date Due   COVID-19 Vaccine (6 - 2023-24 season) 01/16/2024   Medicare Annual Wellness (AWV)  09/24/2024   DTaP/Tdap/Td (4 - Td or Tdap) 07/05/2027   Colonoscopy  10/19/2031   Pneumonia Vaccine 42+ Years old  Completed   INFLUENZA VACCINE  Completed   Hepatitis C Screening  Completed   Zoster Vaccines- Shingrix  Completed   HPV VACCINES  Aged Out    Discussed health benefits of physical activity, and encouraged him to engage in regular exercise appropriate for his age and condition.  Problem List Items Addressed This Visit     BPH (benign prostatic hyperplasia)   Eagle's syndrome   History of colonic polyps   History of herpes genitalis   Hyperlipidemia with target LDL less than 130   Relevant Medications   atorvastatin (LIPITOR) 20 MG tablet   Other Relevant Orders   Lipid panel   Other Visit Diagnoses     Routine general medical examination at a health care facility    -  Primary   Relevant Orders   CBC with Differential/Platelet   Comprehensive metabolic panel   Lipid panel   Right hip pain       Relevant Orders   DG Hip Unilat W OR W/O Pelvis 2-3 Views Right      I explained the fact that the hip pain is probably arthritis in nature and the best approach to that would be conservative care until he gets to the point where it is interfering with his ADLs.  Over this again with the x-ray reports. Discussed the use of Neurontin and if he stops it and has the symptoms recur again, I will place him back on it.  Recommend Tylenol for relief of his aches and pains.   Recheck in 1 year.  Sharlot Gowda, MD

## 2023-09-28 LAB — LIPID PANEL
Chol/HDL Ratio: 3.6 {ratio} (ref 0.0–5.0)
Cholesterol, Total: 181 mg/dL (ref 100–199)
HDL: 50 mg/dL (ref >39)
LDL Chol Calc (NIH): 109 mg/dL — ABNORMAL HIGH (ref 0–99)
Triglycerides: 125 mg/dL (ref 0–149)
VLDL Cholesterol Cal: 22 mg/dL (ref 5–40)

## 2023-09-28 LAB — CBC WITH DIFFERENTIAL/PLATELET
Basophils Absolute: 0.1 10*3/uL (ref 0.0–0.2)
Basos: 1 %
EOS (ABSOLUTE): 0.1 10*3/uL (ref 0.0–0.4)
Eos: 2 %
Hematocrit: 47.9 % (ref 37.5–51.0)
Hemoglobin: 16 g/dL (ref 13.0–17.7)
Immature Grans (Abs): 0 10*3/uL (ref 0.0–0.1)
Immature Granulocytes: 0 %
Lymphocytes Absolute: 1.7 10*3/uL (ref 0.7–3.1)
Lymphs: 26 %
MCH: 30.5 pg (ref 26.6–33.0)
MCHC: 33.4 g/dL (ref 31.5–35.7)
MCV: 91 fL (ref 79–97)
Monocytes Absolute: 0.5 10*3/uL (ref 0.1–0.9)
Monocytes: 8 %
Neutrophils Absolute: 4.2 10*3/uL (ref 1.4–7.0)
Neutrophils: 63 %
Platelets: 338 10*3/uL (ref 150–450)
RBC: 5.25 x10E6/uL (ref 4.14–5.80)
RDW: 12.8 % (ref 11.6–15.4)
WBC: 6.7 10*3/uL (ref 3.4–10.8)

## 2023-09-28 LAB — COMPREHENSIVE METABOLIC PANEL
ALT: 27 [IU]/L (ref 0–44)
AST: 24 [IU]/L (ref 0–40)
Albumin: 4.1 g/dL (ref 3.9–4.9)
Alkaline Phosphatase: 80 [IU]/L (ref 44–121)
BUN/Creatinine Ratio: 19 (ref 10–24)
BUN: 17 mg/dL (ref 8–27)
Bilirubin Total: 0.6 mg/dL (ref 0.0–1.2)
CO2: 24 mmol/L (ref 20–29)
Calcium: 9.4 mg/dL (ref 8.6–10.2)
Chloride: 103 mmol/L (ref 96–106)
Creatinine, Ser: 0.91 mg/dL (ref 0.76–1.27)
Globulin, Total: 2.7 g/dL (ref 1.5–4.5)
Glucose: 85 mg/dL (ref 70–99)
Potassium: 4.6 mmol/L (ref 3.5–5.2)
Sodium: 141 mmol/L (ref 134–144)
Total Protein: 6.8 g/dL (ref 6.0–8.5)
eGFR: 92 mL/min/{1.73_m2} (ref >59)

## 2023-09-28 MED ORDER — ATORVASTATIN CALCIUM 40 MG PO TABS: 40.0000 mg | ORAL_TABLET | Freq: Every day | ORAL | 3 refills | Status: DC

## 2023-09-28 NOTE — Addendum Note (Signed)
Addended by: Ronnald Nian on: 09/28/2023 12:11 PM   Modules accepted: Orders

## 2023-10-06 ENCOUNTER — Encounter: Payer: Self-pay | Admitting: Family Medicine

## 2024-02-19 ENCOUNTER — Encounter: Payer: Self-pay | Admitting: Internal Medicine

## 2024-02-22 ENCOUNTER — Encounter: Payer: Self-pay | Admitting: Family Medicine

## 2024-04-30 DIAGNOSIS — H43811 Vitreous degeneration, right eye: Secondary | ICD-10-CM | POA: Diagnosis not present

## 2024-04-30 DIAGNOSIS — H16223 Keratoconjunctivitis sicca, not specified as Sjogren's, bilateral: Secondary | ICD-10-CM | POA: Diagnosis not present

## 2024-04-30 DIAGNOSIS — H40013 Open angle with borderline findings, low risk, bilateral: Secondary | ICD-10-CM | POA: Diagnosis not present

## 2024-04-30 DIAGNOSIS — H35411 Lattice degeneration of retina, right eye: Secondary | ICD-10-CM | POA: Diagnosis not present

## 2024-04-30 DIAGNOSIS — H40033 Anatomical narrow angle, bilateral: Secondary | ICD-10-CM | POA: Diagnosis not present

## 2024-04-30 DIAGNOSIS — H2513 Age-related nuclear cataract, bilateral: Secondary | ICD-10-CM | POA: Diagnosis not present

## 2024-08-26 ENCOUNTER — Ambulatory Visit (INDEPENDENT_AMBULATORY_CARE_PROVIDER_SITE_OTHER): Admitting: Family Medicine

## 2024-08-26 VITALS — BP 110/72 | HR 72 | Ht 72.5 in | Wt 222.4 lb

## 2024-08-26 DIAGNOSIS — L237 Allergic contact dermatitis due to plants, except food: Secondary | ICD-10-CM | POA: Diagnosis not present

## 2024-08-26 MED ORDER — METHYLPREDNISOLONE ACETATE 80 MG/ML IJ SUSP
80.0000 mg | Freq: Once | INTRAMUSCULAR | Status: AC
  Administered 2024-08-26: 80 mg via INTRAMUSCULAR

## 2024-08-26 MED ORDER — PREDNISONE 10 MG (21) PO TBPK: ORAL_TABLET | ORAL | 0 refills | Status: DC

## 2024-08-26 NOTE — Progress Notes (Signed)
   Subjective:    Patient ID: Jason Ibarra, male    DOB: June 11, 1956, 68 y.o.   MRN: [id number]  HPI He did some yard work this weekend and was apparently exposed to some poison ivy.    Review of Systems     Objective:    Physical Exam He has scattered erythematous vesicular lesions some of which are linear in nature on his abdomen.       Assessment & Plan:  Allergic contact dermatitis due to plants, except food - Plan: predniSONE  (STERAPRED UNI-PAK 21 TAB) 10 MG (21) TBPK tablet He will also be given a shot.  Recommend cool compresses and Benadryl at night.

## 2024-09-26 ENCOUNTER — Other Ambulatory Visit: Payer: Self-pay | Admitting: Family Medicine

## 2024-10-16 ENCOUNTER — Encounter

## 2024-11-04 ENCOUNTER — Ambulatory Visit (INDEPENDENT_AMBULATORY_CARE_PROVIDER_SITE_OTHER): Payer: Medicare Other | Admitting: Family Medicine

## 2024-11-04 ENCOUNTER — Encounter: Payer: Self-pay | Admitting: Family Medicine

## 2024-11-04 VITALS — BP 122/80 | HR 58 | Ht 72.0 in | Wt 224.4 lb

## 2024-11-04 DIAGNOSIS — E785 Hyperlipidemia, unspecified: Secondary | ICD-10-CM | POA: Diagnosis not present

## 2024-11-04 DIAGNOSIS — Z125 Encounter for screening for malignant neoplasm of prostate: Secondary | ICD-10-CM

## 2024-11-04 DIAGNOSIS — Z0000 Encounter for general adult medical examination without abnormal findings: Secondary | ICD-10-CM | POA: Diagnosis not present

## 2024-11-04 DIAGNOSIS — Z23 Encounter for immunization: Secondary | ICD-10-CM | POA: Diagnosis not present

## 2024-11-04 NOTE — Progress Notes (Signed)
 Subjective:   Jason Ibarra is a 68 y.o. male who presents for a Medicare Annual Wellness Visit.  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   ED (erectile dysfunction)    Hemorrhoids    Herpes genitalis in men    Hiatal hernia    Hyperlipidemia    borderline per patient   Rectal pain    Past Surgical History:  Procedure Laterality Date   RETINAL DETACHMENT SURGERY  1989   SHOULDER OPEN ROTATOR CUFF REPAIR  1995   SKIN BIOPSY Left 10/13/2019   left clavicle pigmented seborrheic keratosis   SKIN BIOPSY Right 10/13/2019   right miid chest  pigmented seborrheic keratosis   TONSILLECTOMY  1962   Family History  Problem Relation Age of Onset   Hypertension Father        low blood pressure   Heart disease Father 52       Arrhythmia   AAA (abdominal aortic aneurysm) Father    Cancer Paternal Aunt    Cancer Maternal Grandmother        ovarian   Emphysema Paternal Grandmother    Social History   Occupational History   Not on file  Tobacco Use   Smoking status: Never   Smokeless tobacco: Never  Vaping Use   Vaping status: Never Used  Substance and Sexual Activity   Alcohol use: Yes    Alcohol/week: 2.0 standard drinks of alcohol    Types: 1 Cans of beer, 1 Shots of liquor per week    Comment: 2 per month   Drug use: No   Sexual activity: Yes   Tobacco Counseling Counseling given: Not Answered  SDOH Screenings   Food Insecurity: No Food Insecurity (11/04/2024)  Housing: Low Risk  (11/04/2024)  Transportation Needs: No Transportation Needs (11/04/2024)  Utilities: Not At Risk (11/04/2024)  Alcohol Screen: Low Risk  (11/01/2024)  Depression (PHQ2-9): Low Risk  (11/04/2024)  Financial Resource Strain: Low Risk  (11/01/2024)  Physical Activity: Sufficiently Active (11/04/2024)  Social Connections: Socially Integrated (11/04/2024)  Stress:  No Stress Concern Present (11/04/2024)  Tobacco Use: Low Risk  (11/04/2024)  Health Literacy: Adequate Health Literacy (11/04/2024)   Depression Screen    11/04/2024   10:17 AM 09/25/2023    2:46 PM 09/22/2022    3:49 PM 09/20/2021    9:40 AM 10/13/2019   10:30 AM 08/05/2019    9:10 AM 07/22/2018   10:07 AM  PHQ 2/9 Scores  PHQ - 2 Score 0 0 0 0 0 0 0  PHQ- 9 Score  0  0          Data saved with a previous flowsheet row definition     Goals Addressed             This Visit's Progress    Patient Stated       Play better at golf     Weight (lb) < 200 lb (90.7 kg)   224 lb 6.4 oz (101.8 kg)      Visit info / Clinical Intake: Medicare Wellness Visit Type:: Subsequent Annual Wellness Visit Persons participating in visit:: patient Medicare Wellness Visit Mode:: In-person (required for WTM) Information given by:: patient Interpreter Needed?: No Pre-visit prep was completed: no AWV questionnaire completed by patient prior to visit?: no Living arrangements:: lives with spouse/significant other Patient's Overall Health Status Rating: good Typical amount  of pain: none Does pain affect daily life?: no Are you currently prescribed opioids?: no  Dietary Habits and Nutritional Risks How many meals a day?: 2 Eats fruit and vegetables daily?: yes Most meals are obtained by: eating out In the last 2 weeks, have you had any of the following?: none Diabetic:: no  Functional Status Activities of Daily Living (to include ambulation/medication): Independent Ambulation: Independent Medication Administration: Independent Home Management: Independent Manage your own finances?: yes Primary transportation is: driving Concerns about vision?: no *vision screening is required for WTM* Concerns about hearing?: no  Fall Screening Falls in the past year?: 0 Number of falls in past year: 0 Was there an injury with Fall?: 0 Fall Risk Category Calculator: 0 Patient Fall Risk Level: Low  Fall Risk  Fall Risk Patient at Risk for Falls Due to: No Fall Risks Fall risk Follow up: Falls evaluation completed  Home and Transportation Safety: All rugs have non-skid backing?: (!) no All stairs or steps have railings?: yes Grab bars in the bathtub or shower?: (!) no Have non-skid surface in bathtub or shower?: yes Good home lighting?: yes Regular seat belt use?: yes Hospital stays in the last year:: no  Cognitive Assessment Difficulty concentrating, remembering, or making decisions? : no Will 6CIT or Mini Cog be Completed: no 6CIT or Mini Cog Declined: patient alert, oriented, able to answer questions appropriately and recall recent events  Advance Directives (For Healthcare) Does Patient Have a Medical Advance Directive?: Yes Does patient want to make changes to medical advance directive?: Yes (Inpatient - patient defers changing a medical advance directive at this time - Information given) Type of Advance Directive: Healthcare Power of [place] [employer]; Living will; Out of facility DNR (pink MOST or yellow form) Copy of Healthcare Power of Attorney in Chart?: No - copy requested Copy of Living Will in Chart?: No - copy requested Out of facility DNR (pink MOST or yellow form) in Chart? (Ambulatory ONLY): No - copy requested Pre-existing out of facility DNR order (yellow form or pink MOST form): Pink MOST form placed in chart (order not valid for inpatient use)  Reviewed/Updated  Reviewed/Updated: Reviewed All (Medical, Surgical, Family, Medications, Allergies, Care Teams, Patient Goals)        Objective:    Today's Vitals   11/04/24 1020  BP: 122/80  Pulse: (!) 58  SpO2: 96%  Weight: 224 lb 6.4 oz (101.8 kg)  Height: 6' (1.829 m)   Body mass index is 30.43 kg/m.  Current Medications (verified) Outpatient Encounter Medications as of 11/04/2024  Medication Sig   albuterol  (PROVENTIL  HFA;VENTOLIN  HFA) 108 (90 Base) MCG/ACT inhaler Inhale 2 puffs into the lungs every 6  (six) hours as needed for wheezing or shortness of breath.   atorvastatin  (LIPITOR) 40 MG tablet TAKE 1 TABLET BY MOUTH EVERY DAY   ibuprofen (ADVIL) 200 MG tablet Take 200 mg by mouth every 6 (six) hours as needed.   Multiple Vitamins-Minerals (MULTIVITAMIN WITH MINERALS) tablet Take 1 tablet by mouth daily.     valACYclovir  (VALTREX ) 500 MG tablet 1 tablet twice a day for 5 days at the onset of symptoms   vitamin C (ASCORBIC ACID) 500 MG tablet Take 500 mg by mouth daily. Reported on 02/29/2016   VITAMIN E PO Take by mouth.   [DISCONTINUED] predniSONE  (STERAPRED UNI-PAK 21 TAB) 10 MG (21) TBPK tablet Take as per manufacturer's recommendations   No facility-administered encounter medications on file as of 11/04/2024.   Hearing/Vision screen No results found. Immunizations and  Health Maintenance Health Maintenance  Topic Date Due   Influenza Vaccine  07/25/2024   COVID-19 Vaccine (6 - 2025-26 season) 08/25/2024   Medicare Annual Wellness (AWV)  11/04/2025   DTaP/Tdap/Td (4 - Td or Tdap) 07/05/2027   Colonoscopy  10/19/2031   Pneumococcal Vaccine: 50+ Years  Completed   Hepatitis C Screening  Completed   Zoster Vaccines- Shingrix   Completed   Meningococcal B Vaccine  Aged Out        Assessment/Plan:  This is a routine wellness examination for [place].  Patient Care Team: Vita Morrow, MD as PCP - General (Family Medicine) [facility]  I have personally reviewed and noted the following in the patient's chart:   Medical and social history Use of alcohol, tobacco or illicit drugs  Current medications and supplements including opioid prescriptions. Functional ability and status Nutritional status Physical activity Advanced directives List of other physicians Hospitalizations, surgeries, and ER visits in previous 12 months Vitals Screenings to include cognitive, depression, and falls Referrals and appointments  Orders Placed This Encounter  Procedures    Flu vaccine HIGH DOSE PF(Fluzone Trivalent)   Pfizer Comirnaty Covid-19 Vaccine 3yrs & older   In addition, I have reviewed and discussed with patient certain preventive protocols, quality metrics, and best practice recommendations. A written personalized care plan for preventive services as well as general preventive health recommendations were provided to patient.   Morrow DELENA Vita, MD   11/04/2024   No follow-ups on file.  After Visit Summary: (In Person-Printed) AVS printed and given to the patient  Nurse Notes: nothing

## 2024-11-04 NOTE — Progress Notes (Signed)
   Name: Jason Ibarra   Date of Visit: 11/04/24   Date of last visit with me: Visit date not found   CHIEF COMPLAINT:  Chief Complaint  Patient presents with   Annual Exam    Cpe and awv. No questions or concerns.        HPI:  Discussed the use of AI scribe software for clinical note transcription with the patient, who gave verbal consent to proceed.  History of Present Illness Jason Ibarra is a 68 year old male who presents for a routine follow-up and to discuss family history of aortic aneurysm.  He has a history of high cholesterol, which is currently managed with medication, reducing his levels by about seventy percent.  He mentions a family history of aortic aneurysm, specifically his father, who had to undergo surgery for it. His sister, a retired engineer, civil (consulting), has expressed concern about this family history.  He recalls an incident about a year ago where he pulled something in his cartilage, likely related to his golf swing, which took a long time to heal. He continued to play golf through the injury, and about a month ago, he felt it had finally resolved.  In terms of physical activity, he plays golf and mows the yard. He does not engage in strength training.     OBJECTIVE:       11/04/2024   10:17 AM  Depression screen PHQ 2/9  Decreased Interest 0  Down, Depressed, Hopeless 0  PHQ - 2 Score 0     BP Readings from Last 3 Encounters:  11/04/24 122/80  08/26/24 110/72  09/27/23 118/80    BP 122/80   Pulse (!) 58   Ht 6' (1.829 m)   Wt 224 lb 6.4 oz (101.8 kg)   SpO2 96%   BMI 30.43 kg/m    Physical Exam    Physical Exam  ASSESSMENT/PLAN:   Assessment & Plan Encounter for Medicare annual wellness exam  Hyperlipidemia with target LDL less than 130  Flu vaccine need  Need for COVID-19 vaccine  Screening for prostate cancer  Annual physical exam    Assessment and Plan Assessment & Plan Adult Wellness Visit Overall health stable,  no significant changes in weight or blood pressure. Regular physical activity noted, lacks strength training. - Encouraged strength training exercises, upper and lower body, three times a week for 45 minutes. - Ordered basic labs: cholesterol, blood work, electrolytes, PSA. -Comprehensive annual physical exam completed today. Reviewed interval history, current medical issues, medications, allergies, and preventive care needs. Addressed all patient questions and concerns. Discussed lifestyle factors including diet, exercise, sleep, and stress management. Reviewed recommended age-appropriate screenings, labs, and vaccinations. Counseling provided on healthy habits and routine health maintenance. Follow-up as indicated based on findings and results. -Flu and Covid shot today.   Hyperlipidemia Cholesterol well-managed with medication, reduced by 70%. Family history of aortic aneurysm. Discussed coronary artery calcium  score test for vascular calcification assessment, $100 out-of-pocket, score guides management. - Ordered coronary artery calcium  score test. - Continue current cholesterol medication regimen. - CBC and CMP   Prostate cancer screening Routine screening as part of wellness visit. - Ordered PSA test.  Immunizations Due for COVID-19 and influenza vaccinations. - Administered COVID-19 and influenza vaccinations.     Kalyan Barabas A. Vita MD [facility]

## 2024-11-05 ENCOUNTER — Ambulatory Visit: Payer: Self-pay | Admitting: Family Medicine

## 2024-11-05 LAB — CBC WITH DIFFERENTIAL/PLATELET
Basophils Absolute: 0.1 x10E3/uL (ref 0.0–0.2)
Basos: 1 %
EOS (ABSOLUTE): 0.1 x10E3/uL (ref 0.0–0.4)
Eos: 2 %
Hematocrit: 47.9 % (ref 37.5–51.0)
Hemoglobin: 15.8 g/dL (ref 13.0–17.7)
Immature Grans (Abs): 0 x10E3/uL (ref 0.0–0.1)
Immature Granulocytes: 0 %
Lymphocytes Absolute: 1.7 x10E3/uL (ref 0.7–3.1)
Lymphs: 25 %
MCH: 30.6 pg (ref 26.6–33.0)
MCHC: 33 g/dL (ref 31.5–35.7)
MCV: 93 fL (ref 79–97)
Monocytes Absolute: 0.5 x10E3/uL (ref 0.1–0.9)
Monocytes: 8 %
Neutrophils Absolute: 4.4 x10E3/uL (ref 1.4–7.0)
Neutrophils: 64 %
Platelets: 308 x10E3/uL (ref 150–450)
RBC: 5.17 x10E6/uL (ref 4.14–5.80)
RDW: 12.9 % (ref 11.6–15.4)
WBC: 6.9 x10E3/uL (ref 3.4–10.8)

## 2024-11-05 LAB — COMPREHENSIVE METABOLIC PANEL WITH GFR
ALT: 16 IU/L (ref 0–44)
AST: 17 IU/L (ref 0–40)
Albumin: 4.1 g/dL (ref 3.9–4.9)
Alkaline Phosphatase: 79 IU/L (ref 47–123)
BUN/Creatinine Ratio: 15 (ref 10–24)
BUN: 15 mg/dL (ref 8–27)
Bilirubin Total: 0.6 mg/dL (ref 0.0–1.2)
CO2: 22 mmol/L (ref 20–29)
Calcium: 9.2 mg/dL (ref 8.6–10.2)
Chloride: 103 mmol/L (ref 96–106)
Creatinine, Ser: 1.02 mg/dL (ref 0.76–1.27)
Globulin, Total: 2.3 g/dL (ref 1.5–4.5)
Glucose: 85 mg/dL (ref 70–99)
Potassium: 4 mmol/L (ref 3.5–5.2)
Sodium: 140 mmol/L (ref 134–144)
Total Protein: 6.4 g/dL (ref 6.0–8.5)
eGFR: 80 mL/min/1.73 (ref >59)

## 2024-11-05 LAB — LIPID PANEL
Chol/HDL Ratio: 3.6 ratio (ref 0.0–5.0)
Cholesterol, Total: 169 mg/dL (ref 100–199)
HDL: 47 mg/dL (ref >39)
LDL Chol Calc (NIH): 102 mg/dL — ABNORMAL HIGH (ref 0–99)
Triglycerides: 111 mg/dL (ref 0–149)
VLDL Cholesterol Cal: 20 mg/dL (ref 5–40)

## 2024-11-05 LAB — PSA: Prostate Specific Ag, Serum: 2.3 ng/mL (ref 0.0–4.0)

## 2024-11-19 ENCOUNTER — Ambulatory Visit (HOSPITAL_COMMUNITY)
Admission: RE | Admit: 2024-11-19 | Discharge: 2024-11-19 | Disposition: A | Discharge status: Home / Self Care | Payer: Self-pay | Source: Ambulatory Visit | Attending: Family Medicine | Admitting: Family Medicine

## 2024-11-19 DIAGNOSIS — E785 Hyperlipidemia, unspecified: Secondary | ICD-10-CM | POA: Insufficient documentation

## 2024-11-25 ENCOUNTER — Other Ambulatory Visit: Payer: Self-pay

## 2024-12-24 ENCOUNTER — Other Ambulatory Visit: Payer: Self-pay | Admitting: Family Medicine

## 2025-11-04 ENCOUNTER — Encounter: Admitting: Family Medicine
